# Patient Record
Sex: Female | Born: 1955 | Race: White | Hispanic: No | Marital: Single
Health system: Southern US, Community
[De-identification: ages and names within clinical notes are randomized; demographics above are authoritative.]

---

## 1999-09-21 ENCOUNTER — Encounter (INDEPENDENT_AMBULATORY_CARE_PROVIDER_SITE_OTHER): Payer: Self-pay

## 1999-09-21 ENCOUNTER — Other Ambulatory Visit: Admission: RE | Admit: 1999-09-21 | Discharge: 1999-09-21 | Payer: Self-pay | Admitting: *Deleted

## 2000-07-31 ENCOUNTER — Encounter (INDEPENDENT_AMBULATORY_CARE_PROVIDER_SITE_OTHER): Payer: Self-pay | Admitting: Specialist

## 2000-07-31 ENCOUNTER — Ambulatory Visit (HOSPITAL_COMMUNITY): Admission: RE | Admit: 2000-07-31 | Discharge: 2000-07-31 | Payer: Self-pay | Admitting: *Deleted

## 2003-11-08 ENCOUNTER — Encounter: Admission: RE | Admit: 2003-11-08 | Discharge: 2004-02-06 | Payer: Self-pay | Admitting: Counselor

## 2009-05-27 ENCOUNTER — Emergency Department (HOSPITAL_BASED_OUTPATIENT_CLINIC_OR_DEPARTMENT_OTHER): Admission: EM | Admit: 2009-05-27 | Discharge: 2009-05-27 | Payer: Self-pay | Admitting: Emergency Medicine

## 2009-05-27 ENCOUNTER — Ambulatory Visit: Payer: Self-pay | Admitting: Diagnostic Radiology

## 2009-07-20 ENCOUNTER — Encounter: Payer: Self-pay | Admitting: Family

## 2010-02-19 ENCOUNTER — Encounter: Payer: Self-pay | Admitting: Family

## 2010-02-26 ENCOUNTER — Ambulatory Visit: Payer: Self-pay | Admitting: Family

## 2010-02-26 DIAGNOSIS — E119 Type 2 diabetes mellitus without complications: Secondary | ICD-10-CM | POA: Insufficient documentation

## 2010-02-26 DIAGNOSIS — I10 Essential (primary) hypertension: Secondary | ICD-10-CM | POA: Insufficient documentation

## 2010-02-26 DIAGNOSIS — R9431 Abnormal electrocardiogram [ECG] [EKG]: Secondary | ICD-10-CM | POA: Insufficient documentation

## 2010-02-26 DIAGNOSIS — E785 Hyperlipidemia, unspecified: Secondary | ICD-10-CM | POA: Insufficient documentation

## 2010-02-27 ENCOUNTER — Encounter: Payer: Self-pay | Admitting: Family

## 2010-02-27 ENCOUNTER — Encounter (INDEPENDENT_AMBULATORY_CARE_PROVIDER_SITE_OTHER): Payer: Self-pay | Admitting: *Deleted

## 2010-03-06 ENCOUNTER — Telehealth (INDEPENDENT_AMBULATORY_CARE_PROVIDER_SITE_OTHER): Payer: Self-pay | Admitting: *Deleted

## 2010-03-07 ENCOUNTER — Ambulatory Visit: Payer: Self-pay

## 2010-03-07 ENCOUNTER — Encounter (HOSPITAL_COMMUNITY)
Admission: RE | Admit: 2010-03-07 | Discharge: 2010-05-10 | Disposition: A | Discharge status: Home / Self Care | Payer: Self-pay | Source: Ambulatory Visit | Attending: Internal Medicine | Admitting: Internal Medicine

## 2010-03-07 ENCOUNTER — Ambulatory Visit: Payer: Self-pay | Admitting: Internal Medicine

## 2010-03-12 ENCOUNTER — Ambulatory Visit: Payer: Self-pay

## 2010-03-13 ENCOUNTER — Encounter: Payer: Self-pay | Admitting: Internal Medicine

## 2010-03-15 ENCOUNTER — Telehealth: Payer: Self-pay | Admitting: Family

## 2010-03-28 ENCOUNTER — Ambulatory Visit: Payer: Self-pay | Admitting: Family

## 2010-04-25 ENCOUNTER — Telehealth: Payer: Self-pay | Admitting: Family

## 2010-04-25 ENCOUNTER — Ambulatory Visit: Payer: Self-pay | Admitting: Family

## 2010-04-25 ENCOUNTER — Encounter: Payer: Self-pay | Admitting: Gastroenterology

## 2010-04-25 DIAGNOSIS — Z860100 Personal history of colon polyps, unspecified: Secondary | ICD-10-CM | POA: Insufficient documentation

## 2010-04-25 LAB — CONVERTED CEMR LAB
ALT: 21 units/L (ref 0–35)
AST: 19 units/L (ref 0–37)
Albumin: 4.4 g/dL (ref 3.5–5.2)
Alkaline Phosphatase: 69 units/L (ref 39–117)
Bilirubin, Direct: 0.2 mg/dL (ref 0.0–0.3)
Cholesterol: 193 mg/dL (ref 0–200)
HDL: 52 mg/dL (ref >39)
Indirect Bilirubin: 0.7 mg/dL (ref 0.0–0.9)
LDL Cholesterol: 103 mg/dL — ABNORMAL HIGH (ref 0–99)
Total Bilirubin: 0.9 mg/dL (ref 0.3–1.2)
Total CHOL/HDL Ratio: 3.7
Total Protein: 7 g/dL (ref 6.0–8.3)
Triglycerides: 190 mg/dL — ABNORMAL HIGH (ref <150)
VLDL: 38 mg/dL (ref 0–40)

## 2010-04-26 ENCOUNTER — Encounter: Payer: Self-pay | Admitting: Family

## 2010-06-01 IMAGING — CT CT PELVIS W/ CM
2 of 5 series · 17 of 46 positions shown, 19 images · IV contrast (APPLIED)
Comparison: None

CT ABDOMEN

CLINICAL DATA: Right-sided abdominal pain

CT ABDOMEN AND PELVIS WITH CONTRAST
TECHNIQUE: Multidetector CT imaging of the abdomen and pelvis was
performed using the standard protocol following bolus
administration of intravenous contrast.
Contrast: 100 ml Omnipaque 300 IV.

[Series 2: abd/pelvis 5.0 b31f · axial · 0.95mm/px · z∈[-491,-81]mm · 14 of 94 slices shown, 16 images]
[im 6/94  soft-tissue]
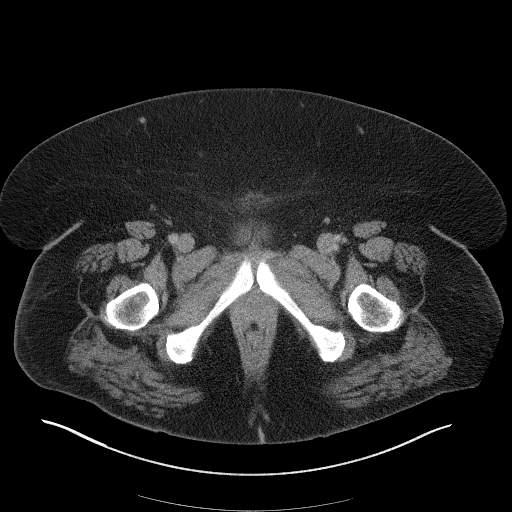
[im 6/94  bone]
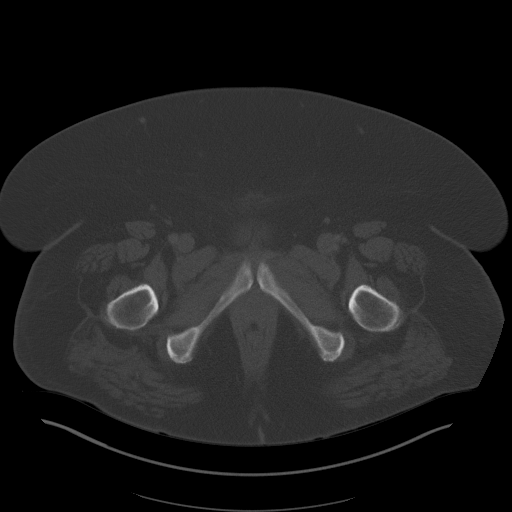
[im 11/94  soft-tissue]
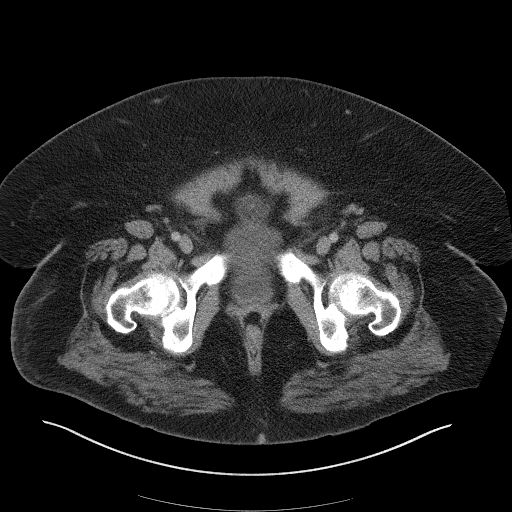
[im 21/94  soft-tissue]
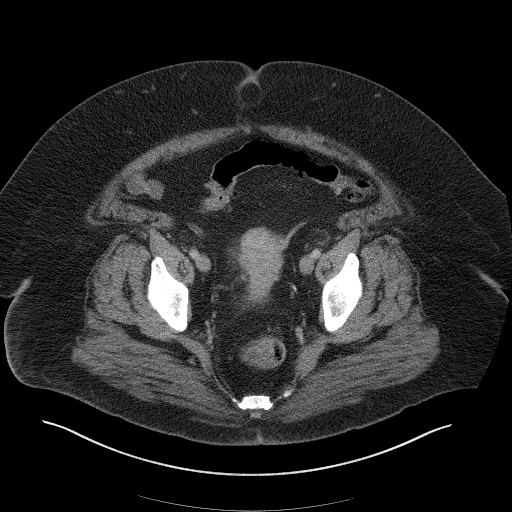
[im 26/94  soft-tissue]
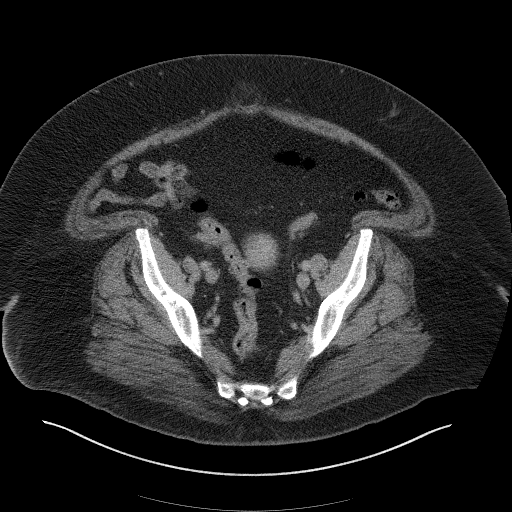
[im 32/94  soft-tissue]
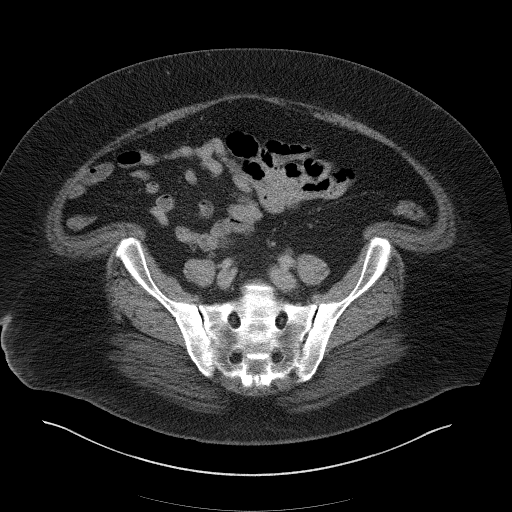
[im 37/94  soft-tissue]
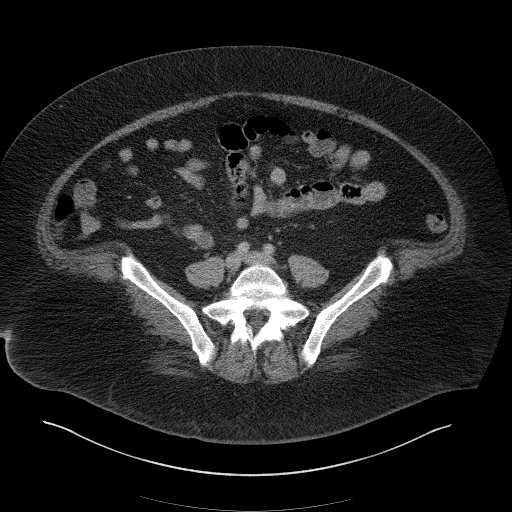
[im 42/94  soft-tissue]
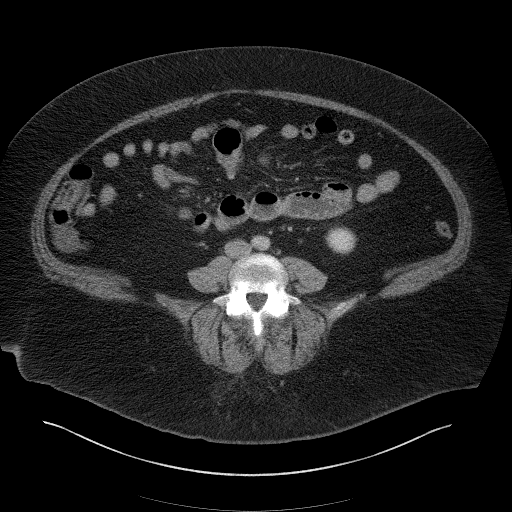
[im 52/94  soft-tissue]
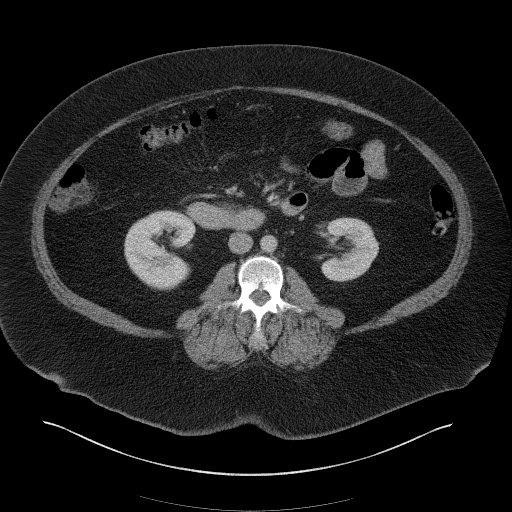
[im 57/94  soft-tissue]
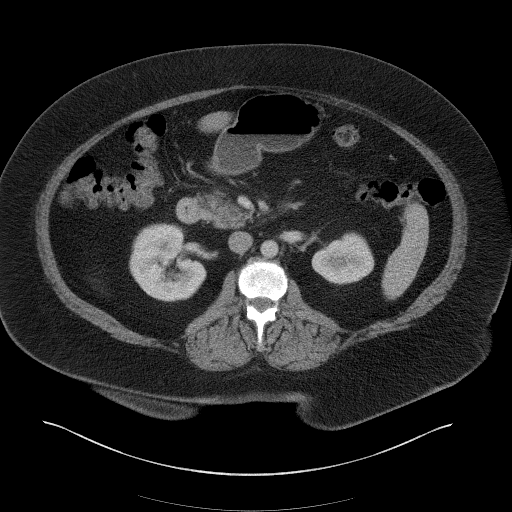
[im 57/94  bone]
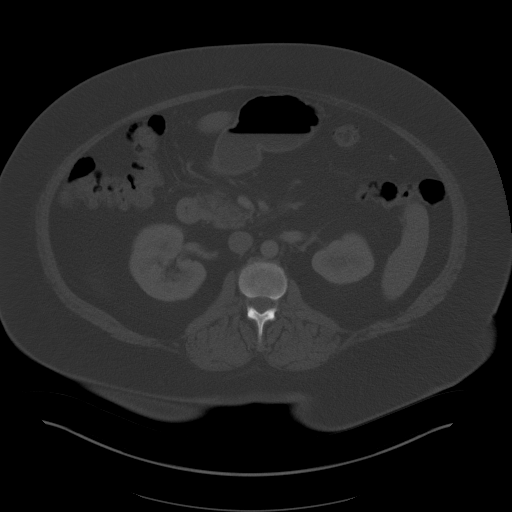
[im 63/94  soft-tissue]
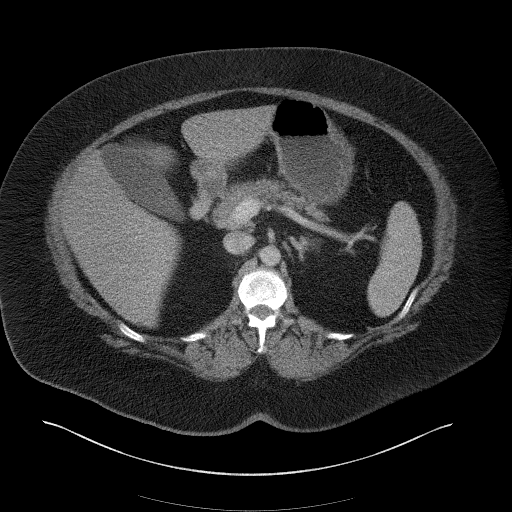
[im 68/94  soft-tissue]
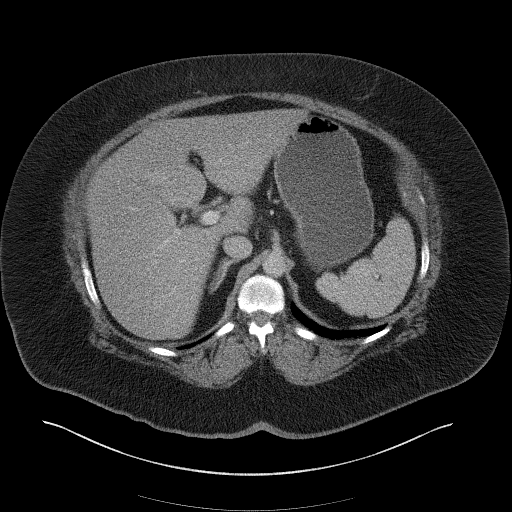
[im 73/94  soft-tissue]
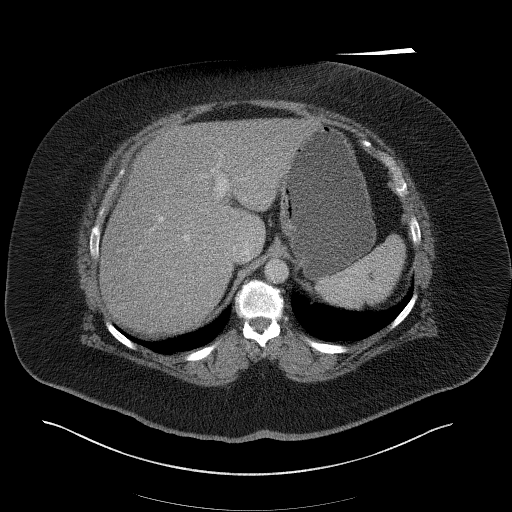
[im 83/94  soft-tissue]
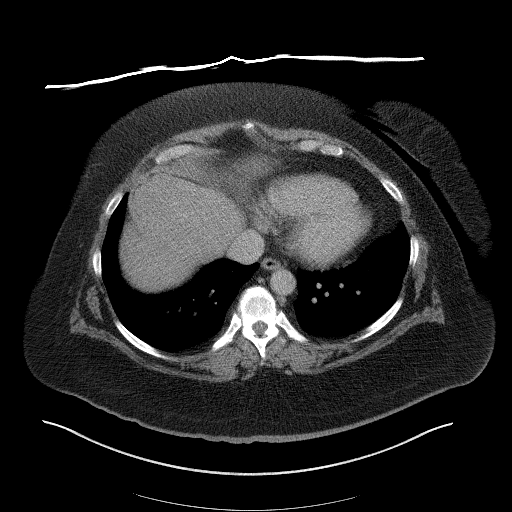
[im 88/94  soft-tissue]
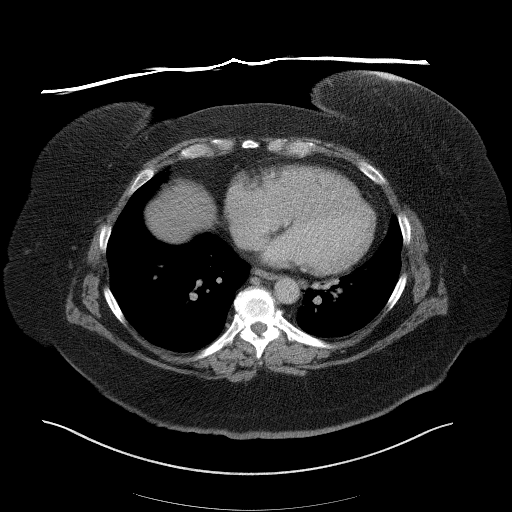

[Series 5: abd/pelvis 3.0 coronal · coronal · 1.02mm/px · 3 of 111 slices shown]
[im 37/111  soft-tissue]
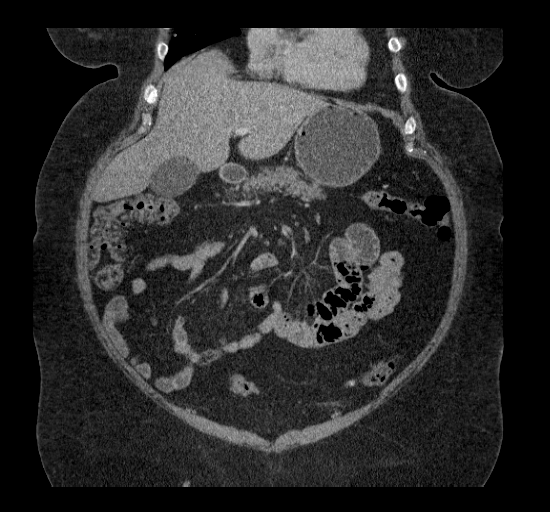
[im 49/111  soft-tissue]
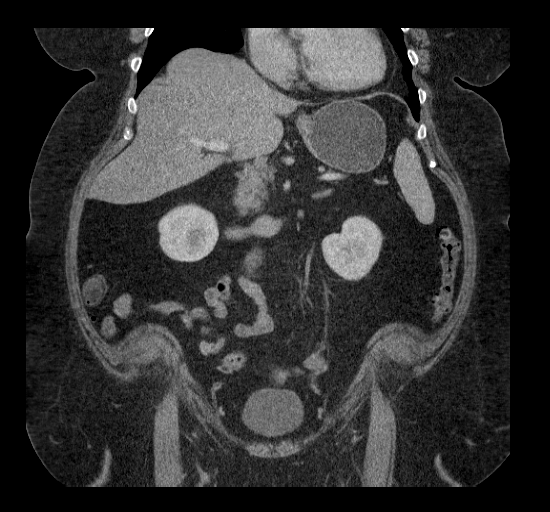
[im 62/111  soft-tissue]
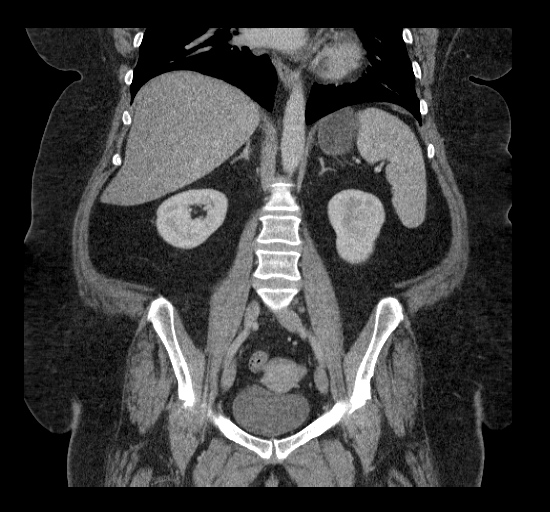

[17 of 46 positions shown; findings below may reference images not displayed]

FINDINGS: Lung bases are clear.  No effusions.  Heart is normal
size.

There is mild fatty infiltration of the liver.  Gallbladder
unremarkable.  Spleen, pancreas, adrenals, kidneys unremarkable.
No hydronephrosis or stones. Bowel grossly unremarkable.  No free
fluid, free air, or adenopathy. Aorta is normal caliber.

No acute bony abnormality.
IMPRESSION: No acute findings in the abdomen

Mild fatty infiltration of the liver.

CT PELVIS
FINDINGS: Appendix is visualized and is normal.  Uterus and adnexa
grossly unremarkable. Bowel grossly unremarkable.  No free fluid,
free air, or adenopathy. Small umbilical hernia containing fat.
There are scattered sigmoid diverticula.

No acute bony abnormality.
IMPRESSION: Sigmoid diverticulosis.

Small umbilical hernia containing fat.

## 2010-06-18 ENCOUNTER — Ambulatory Visit: Payer: Self-pay | Admitting: Family

## 2010-06-18 LAB — CONVERTED CEMR LAB
BUN: 13 mg/dL (ref 6–23)
CO2: 26 meq/L (ref 19–32)
Calcium: 9.4 mg/dL (ref 8.4–10.5)
Chloride: 103 meq/L (ref 96–112)
Creatinine, Ser: 0.7 mg/dL (ref 0.40–1.20)
Creatinine, Urine: 92.9 mg/dL
Glucose, Bld: 162 mg/dL — ABNORMAL HIGH (ref 70–99)
Hgb A1c MFr Bld: 7.4 % — ABNORMAL HIGH (ref <5.7)
Microalb Creat Ratio: 5.4 mg/g (ref 0.0–30.0)
Microalb, Ur: 0.5 mg/dL (ref 0.00–1.89)
Potassium: 4.8 meq/L (ref 3.5–5.3)
Sodium: 140 meq/L (ref 135–145)

## 2010-06-19 ENCOUNTER — Encounter: Payer: Self-pay | Admitting: Family

## 2010-07-12 ENCOUNTER — Telehealth: Payer: Self-pay | Admitting: Family

## 2010-08-01 ENCOUNTER — Ambulatory Visit: Payer: Self-pay | Admitting: Family

## 2010-08-03 ENCOUNTER — Telehealth: Payer: Self-pay | Admitting: Family

## 2010-08-13 ENCOUNTER — Telehealth: Payer: Self-pay | Admitting: Family

## 2010-08-24 ENCOUNTER — Telehealth: Payer: Self-pay | Admitting: Family

## 2010-08-24 ENCOUNTER — Ambulatory Visit: Payer: Self-pay | Admitting: Family

## 2010-09-17 ENCOUNTER — Encounter: Payer: Self-pay | Admitting: Family

## 2010-09-17 ENCOUNTER — Ambulatory Visit: Payer: Self-pay | Admitting: Family

## 2010-09-17 LAB — CONVERTED CEMR LAB
Blood Glucose, AC Bkfst: 143 mg/dL
Hgb A1c MFr Bld: 7.3 % — ABNORMAL HIGH (ref <5.7)

## 2010-09-18 ENCOUNTER — Telehealth: Payer: Self-pay | Admitting: Family

## 2010-10-23 ENCOUNTER — Telehealth: Payer: Self-pay | Admitting: Family

## 2010-11-04 LAB — CONVERTED CEMR LAB
ALT: 22 units/L (ref 0–35)
AST: 23 units/L (ref 0–37)
Albumin: 4.3 g/dL (ref 3.5–5.2)
Alkaline Phosphatase: 68 units/L (ref 39–117)
BUN: 18 mg/dL (ref 6–23)
Basophils Absolute: 0.1 10*3/uL (ref 0.0–0.1)
Basophils Relative: 1 % (ref 0–1)
CO2: 25 meq/L (ref 19–32)
Calcium: 9.8 mg/dL (ref 8.4–10.5)
Chloride: 103 meq/L (ref 96–112)
Cholesterol: 169 mg/dL (ref 0–200)
Creatinine, Ser: 0.67 mg/dL (ref 0.40–1.20)
Eosinophils Absolute: 0.2 10*3/uL (ref 0.0–0.7)
Eosinophils Relative: 3 % (ref 0–5)
Glucose, Bld: 140 mg/dL — ABNORMAL HIGH (ref 70–99)
HCT: 44.9 % (ref 36.0–46.0)
HDL: 47 mg/dL (ref >39)
Hemoglobin: 15.1 g/dL — ABNORMAL HIGH (ref 12.0–15.0)
Hgb A1c MFr Bld: 6.7 % — ABNORMAL HIGH (ref <5.7)
LDL Cholesterol: 76 mg/dL (ref 0–99)
Lymphocytes Relative: 25 % (ref 12–46)
Lymphs Abs: 2.4 10*3/uL (ref 0.7–4.0)
MCHC: 33.6 g/dL (ref 30.0–36.0)
MCV: 88.6 fL (ref 78.0–100.0)
Monocytes Absolute: 0.7 10*3/uL (ref 0.1–1.0)
Monocytes Relative: 7 % (ref 3–12)
Neutro Abs: 6.2 10*3/uL (ref 1.7–7.7)
Neutrophils Relative %: 65 % (ref 43–77)
Pap Smear: NORMAL
Platelets: 337 10*3/uL (ref 150–400)
Potassium: 4.3 meq/L (ref 3.5–5.3)
RBC: 5.07 M/uL (ref 3.87–5.11)
RDW: 13 % (ref 11.5–15.5)
Sodium: 140 meq/L (ref 135–145)
TSH: 1.131 microintl units/mL (ref 0.350–4.500)
Total Bilirubin: 0.9 mg/dL (ref 0.3–1.2)
Total CHOL/HDL Ratio: 3.6
Total Protein: 6.6 g/dL (ref 6.0–8.3)
Triglycerides: 229 mg/dL — ABNORMAL HIGH (ref <150)
VLDL: 46 mg/dL — ABNORMAL HIGH (ref 0–40)
WBC: 9.6 10*3/uL (ref 4.0–10.5)

## 2010-11-06 NOTE — Assessment & Plan Note (Signed)
Summary: 1 month follow up/mhf rsc with pt/mhf--Rm  5   Vital Signs:  Patient profile:   55 year old female Height:      65 inches Weight:      310.50 pounds BMI:     51.86 Temp:     98.2 degrees F oral Pulse rate:   72 / minute Pulse rhythm:   regular Resp:     16 per minute BP sitting:   120 / 86  (right arm) Cuff size:   thigh  Vitals Entered By: Mervin Kung CMA (March 28, 2010 10:01 AM) CC: Room 5  1 month follow up.  Needs refills on all meds. Simvastatin hurts pt's stomach and doesn't seem to digest. Is Patient Diabetic? No   CC:  Room 5  1 month follow up.  Needs refills on all meds. Simvastatin hurts pt's stomach and doesn't seem to digest..  History of Present Illness: Catherine Fleming is a 55 year old female who presents today for follow.    1 )DM- fasting sugars about 100, occasional post prandial up to 200  2) lipids- + muscle pain at times  3) Obesity- completed stress test- this was normal.  Allergies (verified): No Known Drug Allergies  Physical Exam  General:  Morbidly obese white female, awake, alert and NAD Lungs:  Normal respiratory effort, chest expands symmetrically. Lungs are clear to auscultation, no crackles or wheezes. Heart:  Normal rate and regular rhythm. S1 and S2 normal without gallop, murmur, click, rub or other extra sounds. Extremities:  No clubbing, cyanosis, edema, or deformity noted with normal full range of motion of all joints.     Impression & Recommendations:  Problem # 1:  HYPERLIPIDEMIA (ICD-272.4) Assessment Comment Only Will discontinue Simvastatin due to c/o mild myalgias.  Plan to place patient on Livalo Her updated medication list for this problem includes:    Livalo 2 Mg Tabs (Pitavastatin calcium) ..... One tablet by mouth daily in the evening  Problem # 2:  DM (ICD-250.00) Assessment: Comment Only A1C at goal, continue glucophage, ACE, diet, exercise and weight loss efforts. Her updated medication list for this  problem includes:    Lisinopril-hydrochlorothiazide 20-25 Mg Tabs (Lisinopril-hydrochlorothiazide) .Marland Kitchen... Take 1 tablet by mouth once a day    Glucophage Xr 500 Mg Xr24h-tab (Metformin hcl) ..... One tablet by mouth daily  Labs Reviewed: Creat: 0.67 (02/26/2010)    Reviewed HgBA1c results: 6.7 (02/26/2010)  Problem # 3:  HYPERTENSION (ICD-401.9) Assessment: Unchanged BP stable, continue same Her updated medication list for this problem includes:    Atenolol 100 Mg Tabs (Atenolol) .Marland Kitchen... Take 1 tablet by mouth once a day    Lisinopril-hydrochlorothiazide 20-25 Mg Tabs (Lisinopril-hydrochlorothiazide) .Marland Kitchen... Take 1 tablet by mouth once a day  BP today: 120/86 Prior BP: 120/80 (02/26/2010)  Labs Reviewed: K+: 4.3 (02/26/2010) Creat: : 0.67 (02/26/2010)   Chol: 169 (02/26/2010)   HDL: 47 (02/26/2010)   LDL: 76 (02/26/2010)   TG: 229 (02/26/2010)  Problem # 4:  OBESITY, MORBID (ICD-278.01) Assessment: Unchanged Patient is motivated to start exercise routine and I have cleared her to due so as her stress test was normal. Ht: 65 (03/28/2010)   Wt: 310.50 (03/28/2010)   BMI: 51.86 (03/28/2010)  Complete Medication List: 1)  Livalo 2 Mg Tabs (Pitavastatin calcium) .... One tablet by mouth daily in the evening 2)  Atenolol 100 Mg Tabs (Atenolol) .... Take 1 tablet by mouth once a day 3)  Fluoxetine Hcl 20 Mg Caps (Fluoxetine hcl) .Marland KitchenMarland KitchenMarland Kitchen  Take 1 capsule by mouth once a day 4)  Lisinopril-hydrochlorothiazide 20-25 Mg Tabs (Lisinopril-hydrochlorothiazide) .... Take 1 tablet by mouth once a day 5)  [employer] Membership  .... Patient would benefit medically from regular exercise including water aerobic 6)  Glucophage Xr 500 Mg Xr24h-tab (Metformin hcl) .... One tablet by mouth daily  Patient Instructions: 1)  Please follow up in 1 month- come fasting to this appointment.  2)  Good luck with the diet and exercise. Prescriptions: LIVALO 2 MG TABS (PITAVASTATIN CALCIUM) one tablet by mouth daily in the  evening  #30 x 5   Entered and Authorized by:   Lemont Fillers FNP   Signed by:   Lemont Fillers FNP on 03/28/2010   Method used:   Electronically to        [facility] (retail)       [address]       [place], [place]  [address]       Ph: [phone]       Fax: [phone]   RxID:   [id number]   Current Allergies (reviewed today): No known allergies

## 2010-11-06 NOTE — Assessment & Plan Note (Signed)
Summary: NEW PT PHYSICAL DOES NOT NEED PAP/DT--room 4   Vital Signs:  Patient profile:   55 year old female Height:      65 inches Weight:      311.50 pounds BMI:     52.02 Temp:     97.9 degrees F oral Pulse rate:   72 / minute Pulse rhythm:   regular Resp:     18 per minute BP sitting:   120 / 80  (right arm) Cuff size:   thigh  Vitals Entered By: Mervin Kung CMA (Mar 01, 2010 9:03 AM) CC: room 4  New to establish primary care and get physical. Is Patient Diabetic? Yes   CC:  room 4  New to establish primary care and get physical..  History of Present Illness: [name] is a 55 year old female who presents today to establish care.  She was previously being followed by Dr. Quentin Fleming.   Preventative- Last mammogram was done in 2009.  She follows with Dr. Lisbeth Fleming of GYN.  Patient had hyperplastic lesion on colonoscopy, for which she is being followed by Dr. Alben Fleming of GI.  Exercise-  wants to start water aerobics.  Patient has lost 50 pounds with dietary changes.  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    Preventive Screening-Counseling & Management  Alcohol-Tobacco     Alcohol drinks/day: 2 glasses every night     Alcohol type: wine     Smoking Status: never  Caffeine-Diet-Exercise     Caffeine use/day: 2 cups coffee daily     Does Patient Exercise: no  Allergies (verified): No Known Drug Allergies  Past History:  Family History: Last updated: 2010-03-01 MI-- both parents, deceased Heart Disease-- brother Stroke-- Maternal grandparents HTN-- both parents, sister, brother Diabetes--brother  Social History: Last updated: 2010-03-01 Works at [employer] as a Museum/gallery conservator. Never smoked. 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 Single No children + wine- 2 1/2 glasses denies drug useSmoking Status:  never Caffeine use/day:  2 cups coffee daily Does Patient Exercise:  no  Review of Systems       Constitutional: Denies Fever ENT:  Denies nasal congestion or sore throat. Resp: Denies cough CV:  Denies Chest Pain GI:  Denies nausea or vomitting GU: Denies dysuria Lymphatic: Denies lymphadenopathy Musculoskeletal:  R hip pain (sees [facility] [place]) Skin:  notes occasional itching lesions on her skin for which she uses hydrocortisone Psychiatric: uses fluoxetine for anxiety/OCD- feels that this helps significantly with her symptoms.  Denies  any history of depression. Neuro: Denies numbness     Physical Exam  General:  Morbidly obese white female, awake, alert and NAD Head:  Normocephalic and atraumatic without obvious abnormalities. No apparent alopecia or balding. Eyes:  PERRLA Ears:  External ear exam shows no significant lesions or deformities.  Otoscopic examination reveals clear canals, tympanic membranes are intact bilaterally without bulging, retraction, inflammation or discharge. Hearing is grossly normal bilaterally. Mouth:  Oral mucosa and oropharynx without lesions or exudates.  Teeth in good repair. Neck:  Thick neck, no palpable masses or thyroid enlargement, but exam limited due to habitus. Breasts:  No mass, nodules, thickening, tenderness, bulging, retraction, inflamation, nipple discharge or skin changes noted.   Lungs:  Normal respiratory effort, chest expands symmetrically.  Lungs are clear to auscultation, no crackles or wheezes. Heart:  Normal rate and regular rhythm. S1 and S2 normal without gallop, murmur, click, rub or other extra sounds. Abdomen:  protuberant, soft, non-tender, and no distention.  Unable to assess for HSM due to habitus Genitalia:  deferred to GYN Extremities:  No clubbing, cyanosis, edema, or deformity noted with normal full range of motion of all joints.   Neurologic:  No cranial nerve deficits noted. Station and gait are normal. Sensory, motor and coordinative functions appear intact. Skin:  few scabbed lesions noted on forearms/legs Axillary Nodes:  No palpable lymphadenopathy Psych:  Cognition and judgment appear intact. Alert and cooperative with normal attention span and concentration. No apparent delusions, illusions, hallucinations   Impression & Recommendations:  Problem # 1:  Preventive Health Care (ICD-V70.0) Assessment Comment Only Pt follows with GYN for Paps, immunizations reviewed and up to date.  Due for mammogram- will order today.  Check fasting labs.  Patient counselled on diet and weight loss.   EKG performed today notes TWI in V2-V4 and AVL.  Patient is asymptomatic.  Will refer to cardiology for stress testing.  Patient was advised to hold off on initiation of exercise routine until we have reviewed her stress test results.  She verbalizes understanding. Plan follow up in 1 month. Orders: Mammogram (Screening) (Mammo) T-Comprehensive Metabolic Panel [phone]) T-CBC w/Diff [id number]) T-TSH [id number]) Assessment: New  Complete Medication List: 1)  Simvastatin 80 Mg Tabs (Simvastatin) .... Take 1 tab by mouth at bedtime 2)  Atenolol 100 Mg Tabs (Atenolol) .... Take 1 tablet by mouth once a day 3)  Fluoxetine Hcl 20 Mg Caps (Fluoxetine hcl) .... Take 1 capsule by mouth once a day 4)  Lisinopril-hydrochlorothiazide 20-25 Mg Tabs (Lisinopril-hydrochlorothiazide) .... Take 1 tablet by mouth once a day 5)  [employer]  Membership  .... Patient would benefit medically from regular exercise including water aerobic 6)  Glucophage Xr 500 Mg Xr24h-tab (Metformin hcl) .... One tablet by mouth daily  Other Orders: T-Lipid Profile ([id number]) T-Hgb A1C ([id number]) Cardiology Referral (Cardiology)  Patient Instructions: 1)  Please follow up in 1 month. 2)  Good job with weight loss, keep up the good work. 3)  Start exercising slowly, advance as tolerated. Prescriptions: [employer] MEMBER[employer]IP Patient would benefit medically from regular exercise including water aerobic  #1 x 0   Entered and Authorized by:   Lemont Fillers FNP   Signed by:   Lemont Fillers FNP on 02/26/2010   Method used:   Print then Give to Patient   RxID:   [id number]    Preventive Care Screening  Pap Smear:    Date:  07/07/2009    Results:  normal   Colonoscopy:    Date:  02/06/2009    Results:  Hyperplastic Polyp   Mammogram:    Date:  04/06/2008    Results:  normal   Last Tetanus Booster:    Date:  12/06/2003    Results:  Historical     Immunization History:  Influenza Immunization History:    Influenza:  historical (06/30/2009)   Current Allergies (reviewed today): No known allergies

## 2010-11-06 NOTE — Assessment & Plan Note (Signed)
Summary: sinus inf? / tf,cma--Rm 5   Vital Signs:  Patient profile:   55 year old female Height:      65 inches Weight:      314.50 pounds BMI:     52.52 Temp:     98.4 degrees F oral Pulse rate:   84 / minute Pulse rhythm:   regular Resp:     16 per minute BP sitting:   132 / 80  (right arm) Cuff size:   large  Vitals Entered By: Mervin Kung CMA Duncan Dull) (August 01, 2010 2:16 PM) CC: Rm 5  Intermittent left ear pain and fullness x 1 month, now increasing x 2 days. Has taken Coricidin cough & cold OTC. Is Patient Diabetic? Yes Pain Assessment Patient in pain? yes     Location: left ear Comments Pt agrees all med doses and directions are correct. Nicki Guadalajara Fergerson CMA Duncan Dull)  August 01, 2010 2:23 PM    Primary Care Clarabelle Oscarson:  Lemont Fillers FNP  CC:  Rm 5  Intermittent left ear pain and fullness x 1 month and now increasing x 2 days. Has taken Coricidin cough & cold OTC.Marland Kitchen  History of Present Illness: Ms Rappleye is a 55 year old female who presents with complaint of left ear congestion/pain x 2 days.  Has some mild nasal congestion.  Has tried coricidin without improvement.  Denies fever, mild dizziness a few days ago. Denies cough.  Allergies (verified): No Known Drug Allergies  Past History:  Past Medical History: Last updated: 02/26/2010 Diabetes Hypercholesterolemia Colon polyps Diverticulosis  Past Surgical History: Last updated: 02/26/2010 Right Breast Biopsy--1991  Review of Systems       see HPI  Physical Exam  General:  Morbidly obese white female, awake, altert and in NAD Head:  Normocephalic and atraumatic without obvious abnormalities. No apparent alopecia or balding. Ears:  R TM with red streak, some bubbles noted behind the membrane.  L TM is partially obscured by cerumen, but appears erythematous Mouth:  Oral mucosa and oropharynx without lesions or exudates.  Teeth in good repair. Neck:  No deformities, masses, or tenderness  noted. Lungs:  Normal respiratory effort, chest expands symmetrically. Lungs are clear to auscultation, no crackles or wheezes. Heart:  Normal rate and regular rhythm. S1 and S2 normal without gallop, murmur, click, rub or other extra sounds.   Impression & Recommendations:  Problem # 1:  OTITIS MEDIA, ACUTE, LEFT (ICD-382.9) Assessment New Will treat with amoxicillin, pt instructed to call if symptoms worsen or do not improve. Flu shot and pneumovax given today. Her updated medication list for this problem includes:    Aspirin 81 Mg Chew (Aspirin) ..... One tablet by mouth daily    Amoxicillin 500 Mg Cap (Amoxicillin) .Marland Kitchen... Take 1 capsule by mouth three times a day x 10 days  Complete Medication List: 1)  Livalo 2 Mg Tabs (Pitavastatin calcium) .... One tablet by mouth daily in the evening 2)  Atenolol 100 Mg Tabs (Atenolol) .... Take 1 tablet by mouth once a day 3)  Citalopram Hydrobromide 20 Mg Tabs (Citalopram hydrobromide) .... One tablet by mouth daily 4)  Lisinopril-hydrochlorothiazide 20-25 Mg Tabs (Lisinopril-hydrochlorothiazide) .... Take 1 tablet by mouth once a day 5)  Glucophage Xr 500 Mg Xr24h-tab (Metformin hcl) .... Two tabs by mouth daily 6)  Aspirin 81 Mg Chew (Aspirin) .... One tablet by mouth daily 7)  One Touch Ultra Mini Test Strips  .... Check blood sugar once a day. 8)  Amoxicillin 500  Mg Cap (Amoxicillin) .... Take 1 capsule by mouth three times a day x 10 days  Other Orders: Admin 1st Vaccine ([address]) Flu Vaccine 59yrs + ([address]) Pneumococcal Vaccine (09811) Admin of Any Addtl Vaccine (91478)  Patient Instructions: 1)  Please call if your symptoms worsen or do not improve. 2)  Please return in December for routine follow up. Prescriptions: AMOXICILLIN 500 MG CAP (AMOXICILLIN) Take 1 capsule by mouth three times a day X 10 days  #30 x 0   Entered and Authorized by:   Lemont Fillers FNP   Signed by:   Lemont Fillers FNP on 08/01/2010   Method  used:   Electronically to        [facility] (retail)       [address]       [place], [place]  [address]       Ph: [phone]       Fax: [phone]   RxID:   [id number]    Orders Added: 1)  Admin 1st Vaccine [90471] 2)  Flu Vaccine 59yrs + [34742] 3)  Pneumococcal Vaccine [59563] 4)  Admin of Any Addtl Vaccine [90472] 5)  Est. Patient Level III [87564]   Immunizations Administered:  Pneumonia Vaccine:    Vaccine Type: Pneumovax    Site: right deltoid    Mfr: Merck    Dose: 0.5 ml    Route: IM    Given by: Mervin Kung CMA (AAMA)    Exp. Date: 12/20/2011    Lot #: [id number]    VIS given: 09/11/09 version given August 01, 2010.   Immunizations Administered:  Pneumonia Vaccine:    Vaccine Type: Pneumovax    Site: right deltoid    Mfr: Merck    Dose: 0.5 ml    Route: IM    Given by: Mervin Kung CMA (AAMA)    Exp. Date: 12/20/2011    Lot #: [id number]    VIS given: 09/11/09 version given August 01, 2010.  Current Allergies (reviewed today): No known allergies     Flu Vaccine Consent Questions     Do you have a history of severe allergic reactions to this vaccine? no    Any prior history of allergic reactions to egg and/or gelatin? no    Do you have a sensitivity to the preservative Thimersol? no    Do you have a past history of Guillan-Barre Syndrome? no    Do you currently have an acute febrile illness? no    Have you ever had a severe reaction to latex? no    Vaccine information given and explained to patient? yes    Are you currently pregnant? no    Lot Number:[id number]   Exp Date:04/06/2011   Site Given  Left Deltoid IM.  Nicki Guadalajara Fergerson CMA Duncan Dull)  August 01, 2010 2:50 PM

## 2010-11-06 NOTE — Letter (Signed)
   [place] at [facility] [address] [place], [place]  [address]  [place] Phone: [phone]      June 19, 2010   [facility] [address] [place], [place] [address]  RE:  LAB RESULTS  Dear  Ms. Doshi,  The following is an interpretation of your most recent lab tests.  Please take note of any instructions provided or changes to medications that have resulted from your lab work.  ELECTROLYTES:  Good - no changes needed  KIDNEY FUNCTION TESTS:  Good - no changes needed  LIPID PANEL:  Fair - review at your next visit Triglyceride: 190   Cholesterol: 193   LDL: 103   HDL: 52   Chol/HDL%:  3.7 Ratio   DIABETIC STUDIES:  Please see the enclosed Rx for a change in medication Blood Glucose: 162   HgbA1C: 7.4   Microalbumin/Creatinine Ratio: 5.4     Your A1C (diabetic test) is not at goal.  Please increase your Glucophage to 2 tabs once daily and work hard on diet and exercise.  Follow up in 3 months.  Your triglycerides are also high- diet and exercise will help this as well.        Medications Prescribed or Changed GLUCOPHAGE XR 500 MG XR24H-TAB (METFORMIN HCL) two tabs by mouth daily   Sincerely Yours,    Lemont Fillers FNP  Appended Document:  Mailed.

## 2010-11-06 NOTE — Progress Notes (Signed)
Summary: ear pain  Phone Note Call from Patient Call back at [phone]   Caller: Patient Call For: Lemont Fillers FNP Summary of Call: Pt called stating she has been on abx x 2 days and is still having sharp ear pain. Does not feel worse but is not improved. Please advise. Nicki Guadalajara Fergerson CMA Duncan Dull)  August 03, 2010 3:32 PM   Follow-up for Phone Call        rx sent to pharmacy.  Pt was notified of plan per CMA. Follow-up by: Lemont Fillers FNP,  August 03, 2010 4:14 PM    New/Updated Medications: ANTIPYRINE-BENZOCAINE 54-14 MG/ML SOLN (BENZOCAINE-ANTIPYRINE) 2-4 drops in ear left ear three times a day as needed for pain Prescriptions: ANTIPYRINE-BENZOCAINE 54-14 MG/ML SOLN (BENZOCAINE-ANTIPYRINE) 2-4 drops in ear left ear three times a day as needed for pain  #1 x 0   Entered and Authorized by:   Lemont Fillers FNP   Signed by:   Lemont Fillers FNP on 08/03/2010   Method used:   Electronically to        [facility] [place] [address] (retail)       [address]       [place], [place]  [address]       Ph: [phone]       Fax: [phone]   RxID:   [id number]   Appended Document: ear pain Pt called stating [facility] [address] was having computer problems and not able to fill rx's at this time. Called rx into [facility] on [address]. Cancelled rx at [facility]. Pt is aware.

## 2010-11-06 NOTE — Progress Notes (Signed)
Summary: add baby asa  Phone Note Outgoing Call   Summary of Call: Please call patient this afternoon and let her know that I reviewed her medication list and realized that she is not on a baby aspirin.  She should start aspirin 81mg  by mouth daily to protect her heart. Initial call taken by: Lemont Fillers FNP,  April 25, 2010 10:28 AM  Follow-up for Phone Call        Left message with pt's sister, Judeth Cornfield to have pt return my call. Nicki Guadalajara Fergerson CMA Duncan Dull)  April 25, 2010 2:20 PM   Notified pt per [facility] instructions. Pt is agreeable.  Nicki Guadalajara Fergerson CMA Duncan Dull)  April 26, 2010 9:12 AM

## 2010-11-06 NOTE — Letter (Signed)
Summary: [facility]   Imported By: Sherian Rein 06/27/2010 13:15:38  _____________________________________________________________________  External Attachment:    Type:   Image     Comment:   External Document

## 2010-11-06 NOTE — Letter (Signed)
   [place] at [facility] [address] [place], [place]  [address]  [place] Phone: [phone]      April 26, 2010   [facility] [address] [place], [place] [address]  RE:  LAB RESULTS  Dear  Ms. Guajardo,  The following is an interpretation of your most recent lab tests.  Please take note of any instructions provided or changes to medications that have resulted from your lab work.  LIVER FUNCTION TESTS:  Good - no changes needed  LIPID PANEL:  Stable - no changes needed Triglyceride: 190   Cholesterol: 193   LDL: 103   HDL: 52   Chol/HDL%:  3.7 Ratio    Please add Aspirin 81 mg by mouth daily to protect your heart.   Sincerely Yours,    Lemont Fillers FNP  Appended Document:  Mailed.

## 2010-11-06 NOTE — Letter (Signed)
Summary: Screening/[employer]  Screening/[employer]   Imported By: Lanelle Bal 03/07/2010 11:29:03  _____________________________________________________________________  External Attachment:    Type:   Image     Comment:   External Document

## 2010-11-06 NOTE — Miscellaneous (Signed)
Summary: eye exam, 2009  Clinical Lists Changes  Observations: Added new observation of DMEYEEXAMNXT: 05/2009 (06/18/2010 14:53) Added new observation of DMEYEEXMRES: normal (04/25/2008 14:55) Added new observation of EYE EXAM BY: [facility]  (04/25/2008 14:55) Added new observation of DIAB EYE EX: normal (04/25/2008 14:55)        Diabetes Management Exam:    Eye Exam:       Eye Exam done elsewhere          Date: 04/25/2008          Results: normal          Done by: [facility]

## 2010-11-06 NOTE — Letter (Signed)
Summary: Primary Care Consult Scheduled Letter  [place] at [facility]  [address]   [place], [place] [address]   Phone: [phone]  Fax: [phone]      02/27/2010 MRN: [id number]  [facility] [address] [place], [place]  [address]    Dear Ms. Roher,      We have scheduled an appointment for you.  At the recommendation of MELISSA O'SULLIVAN,FNP, we have scheduled you for NUCLEAR STRESS TEST @ [facility]  on JUNE 1 ,2011 at 8:15AM.  Their address [address]  [place] [address] . The office phone number is [phone].  If this appointment day and time is not convenient for you, please feel free to call the office of the doctor you are being referred to at the number listed above and reschedule the appointment.     It is important for you to keep your scheduled appointments. We are here to make sure you are given good patient care. If you have questions or you have made changes to your appointment, please notify us at  [phone], ask for HELEN.    Thank you,  Darral Dash Patient Care Coordinator [place] at [facility]

## 2010-11-06 NOTE — Progress Notes (Signed)
Summary: need referral??  Phone Note Call from Patient Call back at [phone]   Caller: Patient Call For: Catherine Fleming Summary of Call: Pt called stating that her left ear pain is better but she cannot hear out of that ear. Wants to know if she needs to be referred to ENT? Also has noticed some light brown color to her sputum recently.  Please advise.   Follow-up for Phone Call        I would recommend that she wait a few more weeks to see if the hearing in the left ear improves-  sometimes it takes several weeks after an infection for the fluid to resolve in that ear.   Brown sputum could be bronchitis.  If pt has cough or recurrent brown mucous, she should schedule OV. Follow-up by: Catherine Fleming,  August 13, 2010 4:21 PM  Additional Follow-up for Phone Call Additional follow up Details #1::        Pt advised and voices understanding. Declines appt at this time. States she will keep f/u in December. Catherine Fleming CMA Duncan Dull)  August 13, 2010 4:48 PM

## 2010-11-06 NOTE — Progress Notes (Signed)
Summary: Nuclear Pre-Procedure  Phone Note Outgoing Call Call back at [facility] Phone [phone]   Call placed by: Stanton Kidney, EMT-P,  Mar 06, 2010 2:19 PM Action Taken: Phone Call Completed Summary of Call: Reviewed information on Myoview Information Sheet (see scanned document for further details).  Spoke with Patient's sister, Judeth Cornfield.    Nuclear Med Background Indications for Stress Test: Evaluation for Ischemia, Abnormal EKG        Nuclear Pre-Procedure Cardiac Risk Factors: Family History - CAD, Lipids, NIDDM, Obesity Height (in): 65

## 2010-11-06 NOTE — Assessment & Plan Note (Signed)
Summary: Cardiology Nuclear Study  Nuclear Med Background Indications for Stress Test: Evaluation for Ischemia, Abnormal EKG     Symptoms: Rapid HR    Nuclear Pre-Procedure Cardiac Risk Factors: Family History - CAD, Lipids, NIDDM, Obesity Caffeine/Decaff Intake: None NPO After: 8:00 PM Lungs: clear IV 0.9% NS with Angio Cath: 22g     IV Site: (R) AC IV Started by: Irean Hong RN Chest Size (in) 42     Cup Size DD     Height (in): 65 Weight (lb): 309 BMI: 51.61 Tech Comments: Atenolol 4pm yesterday.  Nuclear Med Study 1 or 2 day study:  2 day     Stress Test Type:  Eugenie Birks Reading MD:  Dietrich Pates, MD     Referring MD:  R.Yoo Resting Radionuclide:  Technetium 22m Tetrofosmin     Resting Radionuclide Dose:  33.0 mCi  Stress Radionuclide:  Technetium 47m Tetrofosmin     Stress Radionuclide Dose:  33.0 mCi   Stress Protocol   Lexiscan: 0.4 mg   Stress Test Technologist:  Milana Na EMT-P     Nuclear Technologist:  Burna Mortimer Deal RT-N  Rest Procedure  Myocardial perfusion imaging was performed at rest 45 minutes following the intravenous administration of Myoview Technetium 44m Tetrofosmin.  Stress Procedure  The patient received IV Lexiscan 0.4 mg over 15-seconds.  Myoview injected at 30-seconds.  There were no significant changes, + sob, and lt. headed with infusion.  Quantitative spect images were obtained after a 45 minute delay.  QPS Raw Data Images:  Extensive soft tissue (diaphragm, subcutaneous fat) surrounds heart. Stress Images:  Normal perfusion with mild apical thinning. Rest Images:  NO significant change from the stress images. Subtraction (SDS):  No evidence of ischemia. Transient Ischemic Dilatation:  1.03  (Normal <1.22)  Lung/Heart Ratio:  .39  (Normal <0.45)  Quantitative Gated Spect Images QGS EDV:  79 ml QGS ESV:  24 ml QGS EF:  70 %   Overall Impression  Exercise Capacity: Lexiscan protocol BP Response: Normal blood pressure  response. Clinical Symptoms: No chest pain ECG Impression: No significant ST segment change suggestive of ischemia. Overall Impression: Normal stress nuclear study.

## 2010-11-06 NOTE — Progress Notes (Signed)
  Phone Note Outgoing Call   Call placed by: Lemont Fillers FNP,  August 24, 2010 4:54 PM Call placed to: Patient Summary of Call: Left message on home phone instructing pt to discard rx for Avelox as the "ABC pack" which was mistakenly ordered is a 5 pill pack.  Pt will only need one additional tablet since she has 6 tabs of samples. Initial call taken by: Lemont Fillers FNP,  August 24, 2010 4:55 PM  Follow-up for Phone Call        Called patient back.  She reports that her ear is feeling much better. WIll send single tablet to her pharmacy for 7th day of avelox. Patient aware. Follow-up by: Lemont Fillers FNP,  August 27, 2010 9:55 AM    New/Updated Medications: AVELOX 400 MG TABS (MOXIFLOXACIN HCL) take this tablet to complete the 7th day of treatment Prescriptions: AVELOX 400 MG TABS (MOXIFLOXACIN HCL) take this tablet to complete the 7th day of treatment  #1 x 0   Entered and Authorized by:   Lemont Fillers FNP   Signed by:   Lemont Fillers FNP on 08/27/2010   Method used:   Electronically to        [facility]       [address]       [place], [place]  [address]       Ph: [phone]       Fax: [phone]   RxID:   [id number]

## 2010-11-06 NOTE — Progress Notes (Signed)
Summary: refill--one touch ultra mini test strip  Phone Note Call from Patient Call back at [phone]   Caller: Patient Call For: Lemont Fillers FNP Summary of Call: Pt left voice message requesting refill on One Touch Ultra Mini test strips. Left message for pt to call me back with frequency of testing and confirm pharmacy to send rx to. Nicki Guadalajara Fergerson CMA Duncan Dull)  July 12, 2010 11:44 AM   Follow-up for Phone Call        Pt returned my call and states she is checking her blood suagr once daily. [facility] uses [facility] [address]. Test strips refillled. Pt is aware. Nicki Guadalajara Fergerson CMA Duncan Dull)  July 12, 2010 11:58 AM     New/Updated Medications: * ONE TOUCH ULTRA MINI TEST STRIPS Check blood sugar once a day. Prescriptions: ONE TOUCH ULTRA MINI TEST STRIPS Check blood sugar once a day.  #50 x 2   Entered by:   Mervin Kung CMA (AAMA)   Authorized by:   Lemont Fillers FNP   Signed by:   Mervin Kung CMA (AAMA) on 07/12/2010   Method used:   Faxed to ...       [facility]       [address]       [place], [place]  [address]       Ph: [phone]       Fax: [phone]   RxID:   [id number]

## 2010-11-06 NOTE — Letter (Signed)
Summary: Previsit letter  [facility]  [address] [place], [place] [address]   Phone: [phone]  Fax: [phone]       04/25/2010 MRN: [id number]  [facility] [address] [place], [place]  [address]  Dear Ms. Coombes,  Welcome to the Gastroenterology Division at [facility].    You are scheduled to see a nurse for your pre-procedure visit on 05-25-10 at 11am on the 3rd floor at [facility], [address]  We ask that you try to arrive at our office 15 minutes prior to your appointment time to allow for check-in.  Your nurse visit will consist of discussing your medical and surgical history, your immediate family medical history, and your medications.    Please bring a complete list of all your medications or, if you prefer, bring the medication bottles and we will list them.  We will need to be aware of both prescribed and over the counter drugs.  We will need to know exact dosage information as well.  If you are on blood thinners (Coumadin, Plavix, Aggrenox, Ticlid, etc.) please call our office today/prior to your appointment, as we need to consult with your physician about holding your medication.   Please be prepared to read and sign documents such as consent forms, a financial agreement, and acknowledgement forms.  If necessary, and with your consent, a friend or relative is welcome to sit-in on the nurse visit with you.  Please bring your insurance card so that we may make a copy of it.  If your insurance requires a referral to see a specialist, please bring your referral form from your primary care physician.  No co-pay is required for this nurse visit.     If you cannot keep your appointment, please call [phone] to cancel or reschedule prior to your appointment date.  This allows [place] the opportunity to schedule an appointment for another patient in need of care.    Thank you for choosing [facility] for your medical needs.  We  appreciate the opportunity to care for you.  Please visit [place] at our website  to learn more about our practice.                     Sincerely.                                                                                                                   The Gastroenterology Division

## 2010-11-06 NOTE — Progress Notes (Signed)
Summary: stress test result  Phone Note Call from Patient Call back at [phone]   Caller: Patient Call For: Lemont Fillers FNP Summary of Call: Pt called requesting stress test results. Please advise.  Mervin Kung CMA  March 15, 2010 2:29 PM   Follow-up for Phone Call        Pls notify patient that her stress test was normal. thanks Follow-up by: Lemont Fillers FNP,  March 15, 2010 2:38 PM  Additional Follow-up for Phone Call Additional follow up Details #1::        Pt advised of normal result.  Mervin Kung CMA  March 15, 2010 2:47 PM

## 2010-11-06 NOTE — Letter (Signed)
   [place] at [facility] [address] [place], [place]  [address]  [place] Phone: [phone]      Feb 27, 2010   [facility] [address] [place], [place] [address]  RE:  LAB RESULTS  Dear  Ms. Mcgue,  The following is an interpretation of your most recent lab tests.  Please take note of any instructions provided or changes to medications that have resulted from your lab work.  ELECTROLYTES:  Good - no changes needed  KIDNEY FUNCTION TESTS:  Good - no changes needed  LIPID PANEL:  Stable - no changes needed Triglyceride: 229   Cholesterol: 169   LDL: 76   HDL: 47   Chol/HDL%:  3.6 Ratio  THYROID STUDIES:  Thyroid studies normal TSH: 1.131     DIABETIC STUDIES:  Good - no changes needed Blood Glucose: 140   HgbA1C: 6.7     CBC:  Good - no changes needed   Sincerely Yours,    Lemont Fillers FNP

## 2010-11-06 NOTE — Assessment & Plan Note (Signed)
Summary: 2 month follow up/mhf--rm 5   Vital Signs:  Patient profile:   55 year old female Height:      65 inches Weight:      318 pounds BMI:     53.11 Temp:     98.6 degrees F oral Pulse rate:   66 / minute Pulse rhythm:   regular Resp:     18 per minute BP sitting:   126 / 76  (right arm) Cuff size:   thigh CC: Rm 5   2 month f/u. Has earache in left ear. Is Patient Diabetic? Yes   CC:  Rm 5   2 month f/u. Has earache in left ear.[facility]  History of Present Illness: Catherine Fleming is a 55 year old female who presents today for follow up of her diabetes.  She has gained 7 pounds since her last visit.  Pt tells me that she recently learned that she will be layed off from her job in the end of October and had been "stress eating."   Notes that her fasting sugars have been around 110 pre-prandial.  Post prandial less than 180. Denies symptomatic hypoglycemia.   Denies polyuria or polydypsia.  March 2011 last eye exam- pt reports that this was done by [facility] and reports that there was no diabetic retinopathy.   Allergies (verified): No Known Drug Allergies  Physical Exam  General:  Morbidly obese white female, awake, altert and in NAD Lungs:  Normal respiratory effort, chest expands symmetrically. Lungs are clear to auscultation, no crackles or wheezes. Heart:  Normal rate and regular rhythm. S1 and S2 normal without gallop, murmur, click, rub or other extra sounds. Psych:  Cognition and judgment appear intact. Alert and cooperative with normal attention span and concentration. No apparent delusions, illusions, hallucinations  Diabetes Management Exam:    Foot Exam (with socks and/or shoes not present):       Sensory-Monofilament:          Left foot: normal          Right foot: normal       Inspection:          Left foot: normal          Right foot: normal       Nails:          Left foot: normal          Right foot: normal    Eye Exam:       Eye Exam done  elsewhere          Date: 12/20/2009          Results: normal          Done by: Dr. Margo Aye, [facility]   Impression & Recommendations:  Problem # 1:  OBESITY, MORBID (ICD-278.01) Assessment Deteriorated Patient was counseled on diet exercise and weight loss.  Instructed patient to keep track of her calories carefully.  Problem # 2:  DM (ICD-250.00) Assessment: Unchanged Stable per history.  Requested eye exam.  Continue current meds.  Check labs as below.  Her updated medication list for this problem includes:    Lisinopril-hydrochlorothiazide 20-25 Mg Tabs (Lisinopril-hydrochlorothiazide) .[facility]... Take 1 tablet by mouth once a day    Glucophage Xr 500 Mg Xr24h-tab (Metformin hcl) ..... One tablet by mouth daily    Aspirin 81 Mg Chew (Aspirin) ..... One tablet by mouth daily  Orders: UA Microalbumin-[facility] (13086) TLB-BMP (Basic Metabolic Panel-BMET) (80048-METABOL)  Labs Reviewed: Creat: 0.67 (  02/26/2010)     Last Eye Exam: normal (12/20/2009) Reviewed HgBA1c results: 6.7 (02/26/2010)  Problem # 3:  HYPERTENSION (ICD-401.9) Assessment: Comment Only Stable and unchanged, continue current meds as below.  Her updated medication list for this problem includes:    Atenolol 100 Mg Tabs (Atenolol) .[facility]... Take 1 tablet by mouth once a day    Lisinopril-hydrochlorothiazide 20-25 Mg Tabs (Lisinopril-hydrochlorothiazide) .[facility]... Take 1 tablet by mouth once a day  Orders: T-Hgb A1C ([address]) TLB-BMP (Basic Metabolic Panel-BMET) (80048-METABOL)  BP today: 126/76 Prior BP: 124/74 (04/25/2010)  Labs Reviewed: K+: 4.3 (02/26/2010) Creat: : 0.67 (02/26/2010)   Chol: 193 (04/25/2010)   HDL: 52 (04/25/2010)   LDL: 103 (04/25/2010)   TG: 190 (04/25/2010)  Complete Medication List: 1)  Livalo 2 Mg Tabs (Pitavastatin calcium) .... One tablet by mouth daily in the evening 2)  Atenolol 100 Mg Tabs (Atenolol) .... Take 1 tablet by mouth once a day 3)  Citalopram Hydrobromide 20 Mg Tabs (Citalopram  hydrobromide) .... One tablet by mouth daily 4)  Lisinopril-hydrochlorothiazide 20-25 Mg Tabs (Lisinopril-hydrochlorothiazide) .... Take 1 tablet by mouth once a day 5)  Glucophage Xr 500 Mg Xr24h-tab (Metformin hcl) .... One tablet by mouth daily 6)  Aspirin 81 Mg Chew (Aspirin) .... One tablet by mouth daily  Patient Instructions: 1)  Please schedule a follow-up appointment in 3 months. 2)  Complete your lab work downstairs today. Prescriptions: CITALOPRAM HYDROBROMIDE 20 MG TABS (CITALOPRAM HYDROBROMIDE) one tablet by mouth daily  #30 x 3   Entered and Authorized by:   Lemont Fillers FNP   Signed by:   Lemont Fillers FNP on 06/18/2010   Method used:   Electronically to        [facility]       [address]       [place], [place]  [address]       Ph: [phone]       Fax: [phone]   RxID:   [id number]   Current Allergies (reviewed today): No known allergies

## 2010-11-06 NOTE — Assessment & Plan Note (Signed)
Summary: ear not better / tf,cma--Rm 5   Vital Signs:  Patient profile:   55 year old female Height:      65 inches Weight:      315.25 pounds BMI:     52.65 O2 Sat:      96 % on Room air Temp:     98.2 degrees F oral Pulse rate:   72 / minute Pulse rhythm:   regular Resp:     18 per minute BP sitting:   128 / 74  (right arm) Cuff size:   large  Vitals Entered By: Mervin Kung CMA Duncan Dull) (August 24, 2010 2:27 PM)  O2 Flow:  Room air CC: Pt states she has brown drainage from her left ear x 2 days. Right ear starting to feel full. Also has cough x 2-3 days. Is Patient Diabetic? Yes Pain Assessment Patient in pain? no        Primary Care Donnarae Rae:  Lemont Fillers FNP  CC:  Pt states she has brown drainage from her left ear x 2 days. Right ear starting to feel full. Also has cough x 2-3 days.[facility]  History of Present Illness: Pt seen in follow up fro 10/26 visit.  She was treated at that time with amoxicilin for L OM.  Notes that L ear pain- only relief was from ear drops.  Amoxicillin did not "touch it."  Denies fever.  Denies chills or malaise.  Feels a little tired.  Chronic sinus pain, yellow nasal discharge.  + cough worse in AM, dry. + post nasal drip.  Can't hear out of the left ear.   Allergies (verified): No Known Drug Allergies  Past History:  Past Medical History: Last updated: 02/26/2010 Diabetes Hypercholesterolemia Colon polyps Diverticulosis  Physical Exam  General:  Well-developed,well-nourished,in no acute distress; alert,appropriate and cooperative throughout examination Head:  Normocephalic and atraumatic without obvious abnormalities. No apparent alopecia or balding. Eyes:  PERRLA Ears:  R TM dull, mild erythema without bulging.  L TM yellow effusion,  + purulent material noted in canal.   Lungs:  Normal respiratory effort, chest expands symmetrically. Lungs are clear to auscultation, no crackles or wheezes. Heart:  Normal rate and  regular rhythm. S1 and S2 normal without gallop, murmur, click, rub or other extra sounds. Psych:  Cognition and judgment appear intact. Alert and cooperative with normal attention span and concentration. No apparent delusions, illusions, hallucinations   Impression & Recommendations:  Problem # 1:  OTITIS MEDIA, ACUTE, LEFT (ICD-382.9) Assessment Deteriorated  Her updated medication list for this problem includes:    Aspirin 81 Mg Chew (Aspirin) ..... One tablet by mouth daily    Avelox Abc Pack 400 Mg Tabs (Moxifloxacin hcl) .[facility]... Take as directed    Avelox 400 Mg Tabs (Moxifloxacin hcl) ..... One tablet by mouth once daily for 7 days  Problem # 2:  OTITIS EXTERNA (ICD-380.10) Assessment: New  Her updated medication list for this problem includes:    Antipyrine-benzocaine 54-14 Mg/ml Soln (Benzocaine-antipyrine) .[facility]... 2-4 drops in ear left ear three times a day as needed for pain    Neomycin-polymyxin-hc 3.5-10000-1 Soln (Neomycin-polymyxin-hc) .[facility] 4 drops in left ear 3 times daily for 1 week or until symptoms resolved  Problem # 3:  SINUSITIS (ICD-473.9) Assessment: New  Her updated medication list for this problem includes:    Avelox Abc Pack 400 Mg Tabs (Moxifloxacin hcl) .[facility]... Take as directed    Avelox 400 Mg Tabs (Moxifloxacin hcl) ..... One tablet by mouth  once daily for 7 days  Complete Medication List: 1)  Livalo 2 Mg Tabs (Pitavastatin calcium) .... One tablet by mouth daily in the evening 2)  Atenolol 100 Mg Tabs (Atenolol) .... Take 1 tablet by mouth once a day 3)  Citalopram Hydrobromide 20 Mg Tabs (Citalopram hydrobromide) .... One tablet by mouth daily 4)  Lisinopril-hydrochlorothiazide 20-25 Mg Tabs (Lisinopril-hydrochlorothiazide) .... Take 1 tablet by mouth once a day 5)  Glucophage Xr 500 Mg Xr24h-tab (Metformin hcl) .... Two tabs by mouth daily 6)  Aspirin 81 Mg Chew (Aspirin) .... One tablet by mouth daily 7)  One Touch Ultra Mini Test Strips  .... Check  blood sugar once a day. 8)  Antipyrine-benzocaine 54-14 Mg/ml Soln (Benzocaine-antipyrine) .... 2-4 drops in ear left ear three times a day as needed for pain 9)  Neomycin-polymyxin-hc 3.5-10000-1 Soln (Neomycin-polymyxin-hc) .... 4 drops in left ear 3 times daily for 1 week or until symptoms resolved 10)  Avelox Abc Pack 400 Mg Tabs (Moxifloxacin hcl) .... Take as directed 11)  Avelox 400 Mg Tabs (Moxifloxacin hcl) .... One tablet by mouth once daily for 7 days  Patient Instructions: 1)  Call if symptoms worsen or do not improve.  Prescriptions: AVELOX 400 MG TABS (MOXIFLOXACIN HCL) one tablet by mouth once daily for 7 days  #6 x 0   Entered and Authorized by:   Lemont Fillers FNP   Signed by:   Lemont Fillers FNP on 08/24/2010   Method used:   Samples Given   RxID:   [id number] AVELOX ABC PACK 400 MG TABS (MOXIFLOXACIN HCL) take as directed  #1 x 0   Entered and Authorized by:   Lemont Fillers FNP   Signed by:   Lemont Fillers FNP on 08/24/2010   Method used:   Print then Give to Patient   RxID:   [id number] NEOMYCIN-POLYMYXIN-HC 3.5-10000-1 SOLN (NEOMYCIN-POLYMYXIN-HC) 4 drops in left ear 3 times daily for 1 week or until symptoms resolved  #1 x 0   Entered and Authorized by:   Lemont Fillers FNP   Signed by:   Lemont Fillers FNP on 08/24/2010   Method used:   Electronically to        [facility]       [address]       [place], [place]  [address]       Ph: [phone]       Fax: [phone]   RxID:   [id number]    Orders Added: 1)  Est. Patient Level II [[address]]    Current Allergies (reviewed today): No known allergies

## 2010-11-06 NOTE — Miscellaneous (Signed)
Summary: eye exam 2011  Clinical Lists Changes  Observations: Added new observation of DMEYEEXAMNXT: 03/2011 (06/19/2010 11:52) Added new observation of DMEYEEXMRES: no diabetic retinopathy (02/19/2010 11:56) Added new observation of EYE EXAM BY: [facility]  (02/19/2010 11:56) Added new observation of DIAB EYE EX: no diabetic retinopathy (02/19/2010 11:56)          Diabetes Management Exam:    Eye Exam:       Eye Exam done elsewhere          Date: 02/19/2010          Results: no diabetic retinopathy          Done by: [facility]

## 2010-11-06 NOTE — Assessment & Plan Note (Signed)
Summary: 1 month follow up/mhf--Rm 5   Vital Signs:  Patient profile:   55 year old female Height:      65 inches Weight:      311.50 pounds BMI:     52.02 Temp:     98.1 degrees F oral Pulse rate:   66 / minute Pulse rhythm:   regular Resp:     18 per minute BP sitting:   124 / 74  (right arm) Cuff size:   thigh  Vitals Entered By: Mervin Kung CMA Duncan Dull) (April 25, 2010 9:58 AM) CC: Room 5  1 month follow up. Needs refills on: Atenolol, fluoxetine, Lisinopril-hctz & Glucophage XR. Is Patient Diabetic? Yes   CC:  Room 5  1 month follow up. Needs refills on: Atenolol, fluoxetine, and Lisinopril-hctz & Glucophage XR.Marland Kitchen  History of Present Illness: Catherine Fleming is a 55 year old female who presents today for follow up.  1)Hyperlipidemia-Previously on simvastatin, this was changed to Livalo due to muscle pain.  Since starting Livalo, pt  notes that she has not had any muscle or GI issues. She is very pleased with the change.  2)DM-  Notes that her sugars are about 100-120. Stable.  3) Hx of abnormal colonoscopy- reports that she had a large polyp removed from the junction of the large and small intestine.  Colo was repeated 6 months later and she had some additional smaller polyps removed.  She was told that she would need a follow up colo in September (done by [facility]).     Allergies (verified): No Known Drug Allergies  Physical Exam  General:  Morbidly obese white female, awake, altert and in NAD Head:  Normocephalic and atraumatic without obvious abnormalities. No apparent alopecia or balding. Lungs:  Normal respiratory effort, chest expands symmetrically. Lungs are clear to auscultation, no crackles or wheezes. Heart:  Normal rate and regular rhythm. S1 and S2 normal without gallop, murmur, click, rub or other extra sounds. Extremities:  no peripheral edema noted.   Impression & Recommendations:  Problem # 1:  HYPERLIPIDEMIA  (ICD-272.4) Assessment Comment Only Pt is tolerating livalo. Will repeat lipids and LFT's.  Her updated medication list for this problem includes:    Livalo 2 Mg Tabs (Pitavastatin calcium) ..... One tablet by mouth daily in the evening  Orders: TLB-Lipid Panel (80061-LIPID) TLB-Hepatic/Liver Function Pnl (80076-HEPATIC)  Problem # 2:  COLONIC POLYPS, HX OF (ICD-V12.72) Assessment: Comment Only Will refer to [facility] as they accept her insurance- for further evaluation and f/u colonoscopy. Orders: Gastroenterology Referral (GI)  Problem # 3:  DM (ICD-250.00) Stable, A1C is at goal.  Will add a baby aspirin for cardiac protection. Her updated medication list for this problem includes:    Lisinopril-hydrochlorothiazide 20-25 Mg Tabs (Lisinopril-hydrochlorothiazide) .Marland Kitchen... Take 1 tablet by mouth once a day    Glucophage Xr 500 Mg Xr24h-tab (Metformin hcl) ..... One tablet by mouth daily    Aspirin 81 Mg Chew (Aspirin) ..... One tablet by mouth daily  Labs Reviewed: Creat: 0.67 (02/26/2010)    Reviewed HgBA1c results: 6.7 (02/26/2010)  Complete Medication List: 1)  Livalo 2 Mg Tabs (Pitavastatin calcium) .... One tablet by mouth daily in the evening 2)  Atenolol 100 Mg Tabs (Atenolol) .... Take 1 tablet by mouth once a day 3)  Fluoxetine Hcl 20 Mg Caps (Fluoxetine hcl) .... Take 1 capsule by mouth once a day 4)  Lisinopril-hydrochlorothiazide 20-25 Mg Tabs (Lisinopril-hydrochlorothiazide) .... Take 1 tablet by mouth once a  day 5)  Glucophage Xr 500 Mg Xr24h-tab (Metformin hcl) .... One tablet by mouth daily 6)  Aspirin 81 Mg Chew (Aspirin) .... One tablet by mouth daily  Patient Instructions: 1)  Please complete your labs downstairs today. 2)  Follow up in September. Prescriptions: GLUCOPHAGE XR 500 MG XR24H-TAB (METFORMIN HCL) one tablet by mouth daily  #90 x 1   Entered and Authorized by:   Lemont Fillers FNP   Signed by:   Lemont Fillers FNP on 04/25/2010    Method used:   Electronically to        [facility]ail[facility]       [address]       [place], [place]  [address]       Ph: [phone]       Fax: [phone]   RxID:   [id number] LISINOPRIL-HYDROCHLOROTHIAZIDE 20-25 MG TABS (LISINOPRIL-HYDROCHLOROTHIAZIDE) Take 1 tablet by mouth once a day  #90 x 1   Entered and Authorized by:   Lemont Fillers FNP   Signed by:   Lemont Fillers FNP on 04/25/2010   Method used:   Electronically to        [facility]       [address]       [place], [place]  [address]       Ph: [phone]       Fax: [phone]   RxID:   [id number] FLUOXETINE HCL 20 MG CAPS (FLUOXETINE HCL) Take 1 capsule by mouth once a day  #90 x 1   Entered and Authorized by:   Lemont Fillers FNP   Signed by:   Lemont Fillers FNP on 04/25/2010   Method used:   Electronically to        [facility]       [address]       [place], [place]  [address]       Ph: [phone]       Fax: [phone]   RxID:   [id number] ATENOLOL 100 MG TABS (ATENOLOL) Take 1 tablet by mouth once a day  #90 x 1   Entered and Authorized by:   Lemont Fillers FNP   Signed by:   Lemont Fillers FNP on 04/25/2010   Method used:   Electronically to        [facility])       [address]       [place], [place]  [address]       Ph: [phone]       Fax: [phone]   RxID:   [id number]   Current Allergies (reviewed today): No known allergies

## 2010-11-08 NOTE — Progress Notes (Signed)
Summary: Sinus Infection  Phone Note Call from Patient Call back at Work Phone [phone]   Caller: Patient Call For: Lemont Fillers FNP Summary of Call: Patient called with c/o  nasal drainage dark brown, denies fever, trouble sleeping, productive cough dark brown in color, sinus congestion, and  denies breathing difficulty ongoing for the past week. She was advised to schedule appointment for evaluation, however she states she does not have any insurance. She states Efraim Kaufmann is aware of her circumstances. She would like to know if a Z-Pak could be sent to the pharmacy for her. Initial call taken by: Glendell Docker CMA,  October 23, 2010 9:04 AM  Follow-up for Phone Call        I would recommend doxycycline.  Will send to pharmacy.  Pt will need to be seen if symptoms worsen, or if no improvement in 48 hours.   Follow-up by: Lemont Fillers FNP,  October 23, 2010 9:45 AM  Additional Follow-up for Phone Call Additional follow up Details #1::        Pt notified. Nicki Guadalajara Fergerson CMA Duncan Dull)  October 23, 2010 10:57 AM     New/Updated Medications: DOXYCYCLINE HYCLATE 100 MG CAPS (DOXYCYCLINE HYCLATE) one cap by mouth two times a day for 10 days Prescriptions: DOXYCYCLINE HYCLATE 100 MG CAPS (DOXYCYCLINE HYCLATE) one cap by mouth two times a day for 10 days  #20 x 0   Entered and Authorized by:   Lemont Fillers FNP   Signed by:   Lemont Fillers FNP on 10/23/2010   Method used:   [facility]ron[facility] to        [facility]       [address]       [place], [place]  [address]       Ph: [phone]       Fax: [phone]   RxID:   [id number]

## 2010-11-08 NOTE — Assessment & Plan Note (Signed)
Summary: 3 month follow up/mhf--Rm 4   Vital Signs:  Patient profile:   55 year old female Height:      65 inches Weight:      314 pounds BMI:     52.44 Temp:     97.8 degrees F oral Pulse rate:   60 / minute Pulse rhythm:   regular Resp:     18 per minute BP sitting:   136 / 88  (right arm) Cuff size:   large  Vitals Entered By: Mervin Kung CMA Duncan Dull) (September 17, 2010 9:33 AM) CC: Pt here for 3 month f/u. Ear symptoms are returning. Not able to continue Livalo due to cost. Is Patient Diabetic? Yes Pain Assessment Patient in pain? no      Comments Pt has completed Avelox. Pt agrees all other meds and doses are correct. Nicki Guadalajara Fergerson CMA Duncan Dull)  September 17, 2010 9:39 AM    [facility]:  Lemont Fillers FNP  CC:  Pt here for 3 month f/u. Ear symptoms are returning. Not able to continue Livalo due to cost..  History of Present Illness: Ms Strzelecki is a 55 year old female who presents today for follow up.  1) Left ear pain and drainage.  Initially improved after antibiotics.  Also had some bloody drainage.    2)DM2-  Fasting sugars 120-150, post prandial generally under 200.   3) Hyperlipidemia- tolerating livalo- cost was a concern.   Allergies (verified): No Known Drug Allergies  Past History:  Past Medical History: Last updated: 02/26/2010 Diabetes Hypercholesterolemia Colon polyps Diverticulosis  Past Surgical History: Last updated: 02/26/2010 Right Breast Biopsy--1991  Review of Systems       se HPI  Physical Exam  General:  Well-developed,well-nourished,in no acute distress; alert,appropriate and cooperative throughout examination Head:  Normocephalic and atraumatic without obvious abnormalities. No apparent alopecia or balding. Eyes:  PERRLA, sclera are clear no injection Ears:  R RM normal.  L Canal is swollen, erythematous and noted to have purulent drainage in canal.  TM is obscured by drainage. Mouth:  Oral mucosa and  oropharynx without lesions or exudates.  Teeth in good repair. Neck:  No deformities, masses, or tenderness noted. Lungs:  Normal respiratory effort, chest expands symmetrically. Lungs are clear to auscultation, no crackles or wheezes. Heart:  Normal rate and regular rhythm. S1 and S2 normal without gallop, murmur, click, rub or other extra sounds.  Diabetes Management Exam:    Foot Exam (with socks and/or shoes not present):       Inspection:          Left foot: normal          Right foot: normal       Nails:          Left foot: normal          Right foot: normal   Impression & Recommendations:  Problem # 1:  OTITIS EXTERNA (ICD-380.10) Assessment Deteriorated Pt was instructed to start neomycin-polymyxin.  Will also plan to treat with Augmentin for coverage of any underlying middle ear infection.  Unable to visualize the TM despite lavage of ear canal.     Her updated medication list for this problem includes:    Antipyrine-benzocaine 54-14 Mg/ml Soln (Benzocaine-antipyrine) .Marland Kitchen... 2-4 drops in ear left ear three times a day as needed for pain    Neomycin-polymyxin-hc 3.5-10000-1 Soln (Neomycin-polymyxin-hc) .[facility] Kitchen 4 drops in left ear 3 times daily for 1 week or until symptoms resolved  Problem #  2:  DM (ICD-250.00) CBG's appear stable.  Will check A1C- was 7.4 last visit.  Her updated medication list for this problem includes:    Lisinopril-hydrochlorothiazide 20-25 Mg Tabs (Lisinopril-hydrochlorothiazide) .Marland Kitchen... Take 1 tablet by mouth once a day    Glucophage Xr 500 Mg Xr24h-tab (Metformin hcl) .Marland Kitchen..Marland Kitchen Two tabs by mouth daily    Aspirin 81 Mg Chew (Aspirin) ..... One tablet by mouth daily  Orders: Glucose, (CBG) [phone]) T-Hgb A1C [phone])  Labs Reviewed: Creat: 0.70 (06/18/2010)     Last Eye Exam: no diabetic retinopathy (02/19/2010) Reviewed HgBA1c results: 7.4 (06/18/2010)  6.7 (02/26/2010)  Problem # 3:  HYPERLIPIDEMIA (ICD-272.4) Assessment: Unchanged Samples  given of 4mg  tabs (#7) pt instructed to take 1/2 tablet once daily.   She will try again to fill rx with coupon- now that she no longer has insurance. lot [id number] exp 6/12 Her updated medication list for this problem includes:    Livalo 2 Mg Tabs (Pitavastatin calcium) ..... One tablet by mouth daily in the evening  Complete Medication List: 1)  Livalo 2 Mg Tabs (Pitavastatin calcium) .... One tablet by mouth daily in the evening 2)  Atenolol 100 Mg Tabs (Atenolol) .... Take 1 tablet by mouth once a day 3)  Citalopram Hydrobromide 20 Mg Tabs (Citalopram hydrobromide) .... One tablet by mouth daily 4)  Lisinopril-hydrochlorothiazide 20-25 Mg Tabs (Lisinopril-hydrochlorothiazide) .... Take 1 tablet by mouth once a day 5)  Glucophage Xr 500 Mg Xr24h-tab (Metformin hcl) .... Two tabs by mouth daily 6)  Aspirin 81 Mg Chew (Aspirin) .... One tablet by mouth daily 7)  One Touch Ultra Mini Test Strips  .... Check blood sugar once a day. 8)  Antipyrine-benzocaine 54-14 Mg/ml Soln (Benzocaine-antipyrine) .... 2-4 drops in ear left ear three times a day as needed for pain 9)  Neomycin-polymyxin-hc 3.5-10000-1 Soln (Neomycin-polymyxin-hc) .... 4 drops in left ear 3 times daily for 1 week or until symptoms resolved 10)  Augmentin 875-125 Mg Tabs (Amoxicillin-pot clavulanate) .... One tab by mouth two times a day for 10 days  Patient Instructions: 1)  Please complete your lab work on the first floor. 2)  Call if your ear pain, worsens, or if it is not resolved when you complete your antibiotics. 3)  Have a nice Holiday! 4)  Follow up in 3 months. Prescriptions: NEOMYCIN-POLYMYXIN-HC 3.5-10000-1 SOLN (NEOMYCIN-POLYMYXIN-HC) 4 drops in left ear 3 times daily for 1 week or until symptoms resolved  #1 x 0   Entered and Authorized by:   Lemont Fillers FNP   Signed by:   Lemont Fillers FNP on 09/17/2010   Method used:   Electronically to        [facility]        [address]       [place], [place]  [address]       Ph: [phone]       Fax: [phone]   RxID:   [id number] AUGMENTIN 875-125 MG TABS (AMOXICILLIN-POT CLAVULANATE) one tab by mouth two times a day for 10 days  #20 x 0   Entered and Authorized by:   Lemont Fillers FNP   Signed by:   Lemont Fillers FNP on 09/17/2010   Method used:   Electronically to        [facility] ([facility]ail)       [address]       [place]  [address]       [place], [place]  [address]       Ph: [phone]       Fax: [phone]   RxID:   [id number] LIVALO 2 MG TABS (PITAVASTATIN CALCIUM) one tablet by mouth daily in the evening  #30 x 2   Entered and Authorized by:   Lemont Fillers FNP   Signed by:   Lemont Fillers FNP on 09/17/2010   Method used:   Print then Give to Patient   RxID:   [id number]    Orders Added: 1)  Glucose, (CBG) [82962] 2)  T-Hgb A1C [[address]36-23375] 3)  Est. Patient Level II [[address]]     Current Allergies (reviewed today): No known allergies   Laboratory Results   Blood Tests    Date/Time Reported: Mervin Kung CMA (AAMA)  September 17, 2010 9:45 AM   CBG Fasting:: 143mg /dL

## 2010-11-08 NOTE — Progress Notes (Signed)
Summary: lab result  Phone Note Outgoing Call   Summary of Call: Please call patient and let her know that her A1C is not yet at goal. (7.3, goal under 7.0)  She should keep working hard on diet, exercise, and weight loss.  Also,  I would like for her to increase her metformin to 3  tabs by mouth once daily as below.   Initial call taken by: Lemont Fillers FNP,  September 18, 2010 10:53 AM  Follow-up for Phone Call        Left message with pt's sister to have pt return my call. Nicki Guadalajara Fergerson CMA Duncan Dull)  September 18, 2010 11:00 AM   Left message on machine to return my call. Nicki Guadalajara Fergerson CMA Duncan Dull)  September 19, 2010 1:39 PM   Additional Follow-up for Phone Call Additional follow up Details #1::        Pt notified and states she has already picked up rx. Nicki Guadalajara Fergerson CMA Duncan Dull)  September 20, 2010 9:14 AM     New/Updated Medications: GLUCOPHAGE XR 500 MG XR24H-TAB (METFORMIN HCL) 3  tabs by mouth once daily Prescriptions: GLUCOPHAGE XR 500 MG XR24H-TAB (METFORMIN HCL) 3  tabs by mouth once daily  #90 x 2   Entered and Authorized by:   Lemont Fillers FNP   Signed by:   Lemont Fillers FNP on 09/18/2010   Method used:   Electronically to        [facility]       [address]       [place], [place]  [address]       Ph: [phone]       Fax: [phone]   RxID:   [id number]

## 2010-12-18 ENCOUNTER — Ambulatory Visit: Payer: Self-pay | Admitting: Family

## 2011-01-12 LAB — CBC
HCT: 43.9 % (ref 36.0–46.0)
Hemoglobin: 14.6 g/dL (ref 12.0–15.0)
MCHC: 33.2 g/dL (ref 30.0–36.0)
MCV: 90.3 fL (ref 78.0–100.0)
Platelets: 297 10*3/uL (ref 150–400)
RBC: 4.86 MIL/uL (ref 3.87–5.11)
RDW: 12.7 % (ref 11.5–15.5)
WBC: 9.5 10*3/uL (ref 4.0–10.5)

## 2011-01-12 LAB — URINALYSIS, ROUTINE W REFLEX MICROSCOPIC
Bilirubin Urine: NEGATIVE
Glucose, UA: NEGATIVE mg/dL
Hgb urine dipstick: NEGATIVE
Ketones, ur: NEGATIVE mg/dL
Nitrite: NEGATIVE
Protein, ur: NEGATIVE mg/dL
Specific Gravity, Urine: 1.024 (ref 1.005–1.030)
Urobilinogen, UA: 0.2 mg/dL (ref 0.0–1.0)
pH: 6 (ref 5.0–8.0)

## 2011-01-12 LAB — BASIC METABOLIC PANEL
BUN: 17 mg/dL (ref 6–23)
CO2: 29 mEq/L (ref 19–32)
Calcium: 9.7 mg/dL (ref 8.4–10.5)
Chloride: 103 mEq/L (ref 96–112)
Creatinine, Ser: 0.7 mg/dL (ref 0.4–1.2)
GFR calc Af Amer: >60 mL/min (ref >60)
GFR calc non Af Amer: >60 mL/min (ref >60)
Glucose, Bld: 116 mg/dL — ABNORMAL HIGH (ref 70–99)
Potassium: 3.9 mEq/L (ref 3.5–5.1)
Sodium: 140 mEq/L (ref 135–145)

## 2011-01-12 LAB — DIFFERENTIAL
Basophils Absolute: 0.1 10*3/uL (ref 0.0–0.1)
Basophils Relative: 1 % (ref 0–1)
Eosinophils Absolute: 0.2 10*3/uL (ref 0.0–0.7)
Eosinophils Relative: 2 % (ref 0–5)
Lymphocytes Relative: 20 % (ref 12–46)
Lymphs Abs: 1.9 10*3/uL (ref 0.7–4.0)
Monocytes Absolute: 0.6 10*3/uL (ref 0.1–1.0)
Monocytes Relative: 7 % (ref 3–12)
Neutro Abs: 6.7 10*3/uL (ref 1.7–7.7)
Neutrophils Relative %: 70 % (ref 43–77)

## 2011-01-12 LAB — URINE MICROSCOPIC-ADD ON

## 2011-02-15 ENCOUNTER — Other Ambulatory Visit: Payer: Self-pay | Admitting: Family

## 2011-02-18 ENCOUNTER — Other Ambulatory Visit: Payer: Self-pay | Admitting: Family

## 2011-02-18 NOTE — Telephone Encounter (Signed)
Pt was changed to Citalopram in 06/2010. Pt is past due for follow up. Left message on machine for pt to return my call to verify med request and schedule f/u.

## 2011-02-19 NOTE — Telephone Encounter (Signed)
Left message on machine for pt to return my call  

## 2011-02-20 NOTE — Telephone Encounter (Signed)
Left detailed message on pt's cell to call re: rx request and verification.

## 2011-02-20 NOTE — Telephone Encounter (Signed)
OK to give 30 tabs with 2 refills of fluoxetine 20mg .

## 2011-02-20 NOTE — Telephone Encounter (Signed)
Pt returned my call and states that she was changed back to Prozac at her last visit because the Citalopram was not helping. She states we did not give her a Rx for the Prozac because she still had some. Pt states she will have to call back to schedule appt as she is without ins. And will have to arrange for funds to be seen. Please advise re: Prozac request.

## 2011-02-21 NOTE — Telephone Encounter (Signed)
Left message on cell# to return my call. 

## 2011-02-22 NOTE — Telephone Encounter (Signed)
Left detailed message on cell# re: rx authorization, need for appt before additional refills expire and to call if any questions.

## 2011-02-22 NOTE — H&P (Signed)
[facility]  Patient:    Catherine Fleming, Catherine Fleming                       MRN: [id number] Adm. Date:  07/31/00 Attending:  Sung Amabile. Roslyn Smiling, M.D.                         History and Physical  DATE OF BIRTH:                Mar 13, 1956  CHIEF COMPLAINT:              Irregular bleeding despite Depo-Provera and thickened endometrium on ultrasound.  HISTORY OF PRESENT ILLNESS:   A 55 year old woman, G0, using Depo-Provera since January of this year with irregular bleeding despite receiving medication every 11 weeks.  Workup has included a pelvic ultrasound in late August which showed a slightly thickened endometrium as well as uterine fibroids.  Endometrial biopsy was performed before Depo-Provera was initiated in December of last year and revealed benign proliferative-type endometrium with breakdown.                                The patient is morbidly obese and, because of the thickened endometrium by ultrasound, previously normal biopsy and bleeding despite ongoing Depo-Provera use, hysteroscopy has been suggested to rule out endometrial pathology.  PAST MEDICAL HISTORY:         Medical history of sinus infection, probable endometriosis, history of depression.  PAST SURGICAL HISTORY:        Right breast cystectomy.  MEDICATIONS:                  Prozac, Claritin, Anaprox, Aciphex.  ALLERGIES:                    None.  FAMILY HISTORY:               Mother and father both died of myocardial infarction.  Both had history of hypertension.  Mother with history of Graves disease.  Brother died of myocardial infarction at age 54.  Another brother with hypertension and adult-onset diabetes mellitus.  SOCIAL HISTORY:               Single.  Works at Clinical biochemist for [employer].  Denies tobacco use, uses alcohol socially.  PHYSICAL EXAMINATION:  GENERAL:                      Morbidly obese woman, blood pressure 136/82, weight 103.6.  VITAL  SIGNS:                  Stable.  HEENT:                        Within normal limits.  NECK:                         Without thyromegaly.  CHEST:                        Clear.  COR:                          Regular rate and rhythm.  S1, S2 normal.  BREASTS:  Without masses.  ABDOMEN:                      Soft, nontender, without organomegaly, mass, or hernia.  BACK:                         Without CVAT.  GENITOURINARY:                External genitalia, BUS, and vagina without lesions.  Cervix without lesion.  Uterus anteverted, normal size, nontender, and mobile.  Adnexa normal to palpation.  RECTOVAGINAL:                 Exam confirmatory.  EXTREMITIES:                  Without CCE.  SKIN:                         Without lesions.  NEUROLOGIC:                   Grossly intact.  LABORATORY DATA:              Pap within normal limits, September 2001.  IMPRESSION:                   1. Abnormal uterine bleeding, thickened                                  endometrium - rule out endometrial pathology.                               2. Fibroids.                               3. History of symptoms consistent with                                  endometriosis.                               4. Morbid obesity.  PLAN:                         Operative hysteroscopy and dilation and curettage.  The patient has been counseled regarding the benefits, risks, and options of this procedure prior to surgery.  Questions have been answered and consent has been obtained. DD:  07/30/00 TD:  07/30/00 Job: [id number]

## 2018-03-25 DIAGNOSIS — I10 Essential (primary) hypertension: Secondary | ICD-10-CM | POA: Diagnosis not present

## 2018-03-25 DIAGNOSIS — E7800 Pure hypercholesterolemia, unspecified: Secondary | ICD-10-CM | POA: Diagnosis not present

## 2018-03-25 DIAGNOSIS — Z7984 Long term (current) use of oral hypoglycemic drugs: Secondary | ICD-10-CM | POA: Diagnosis not present

## 2018-03-25 DIAGNOSIS — E1165 Type 2 diabetes mellitus with hyperglycemia: Secondary | ICD-10-CM | POA: Diagnosis not present

## 2018-03-25 DIAGNOSIS — Z6841 Body Mass Index (BMI) 40.0 and over, adult: Secondary | ICD-10-CM | POA: Diagnosis not present

## 2018-08-28 DIAGNOSIS — M25551 Pain in right hip: Secondary | ICD-10-CM | POA: Diagnosis not present

## 2018-09-23 DIAGNOSIS — I10 Essential (primary) hypertension: Secondary | ICD-10-CM | POA: Diagnosis not present

## 2018-09-23 DIAGNOSIS — E119 Type 2 diabetes mellitus without complications: Secondary | ICD-10-CM | POA: Diagnosis not present

## 2018-09-23 DIAGNOSIS — E559 Vitamin D deficiency, unspecified: Secondary | ICD-10-CM | POA: Diagnosis not present

## 2020-10-05 ENCOUNTER — Encounter: Payer: Self-pay | Admitting: Physical Therapy

## 2020-10-05 ENCOUNTER — Ambulatory Visit: Payer: Medicare Other | Attending: Nurse Practitioner | Admitting: Physical Therapy

## 2020-10-05 ENCOUNTER — Other Ambulatory Visit: Payer: Self-pay

## 2020-10-05 DIAGNOSIS — M6281 Muscle weakness (generalized): Secondary | ICD-10-CM

## 2020-10-05 DIAGNOSIS — R2689 Other abnormalities of gait and mobility: Secondary | ICD-10-CM

## 2020-10-05 DIAGNOSIS — R2681 Unsteadiness on feet: Secondary | ICD-10-CM

## 2020-10-05 NOTE — Patient Instructions (Addendum)
   SIT TO STAND: No Device    Sit with feet shoulder-width apart, on floor. Lean chest forward, raise hips up from surface. Straighten hips and knees. Weight bear equally on left and right sides. _10__ reps per set, _1-2_ sets per day, _5__ days per week Place left leg closer to sitting surface.  .    Hip Abduction: Modified    Lying on right side with pillow between thighs, raise top leg from pillow, rotating slightly out. Repeat __10__ times per set. Do __1-2__ sets per session. Do _1___ sessions per day.  Try to hold for 3 - 5 secs  http://orth.exer.us/705   Copyright  VHI. All rights reserved.    Straight Leg Raise    Tighten stomach and slowly raise locked right leg _10___ inches from floor. Repeat _10___ times per set. Do _1-2___ sets per session. Do __1__ sessions per day.  Try to hold for 3-5 secs  http://orth.exer.us/1103   Copyright  VHI. All rights reserved.

## 2020-10-06 ENCOUNTER — Encounter: Payer: Self-pay | Admitting: Physical Therapy

## 2020-10-06 NOTE — Therapy (Signed)
[facility] [address] [place], [place], [address] Phone: [phone]   Fax:  [phone]  Physical Therapy Evaluation  Patient Details  Name: Catherine Fleming MRN: [id number] Date of Birth: Jul 07, 1956 Referring Provider (PT): Azzie Roup, FNP;  cc: Dr. Royston Sinner Corrington   Encounter Date: 10/05/2020   PT End of Session - 10/06/20 1522    Visit Number 1    Number of Visits 6    Date for PT Re-Evaluation 11/10/20    Authorization Type UHC Medicare    Authorization Time Period 10-05-20 - 12-04-20    PT Start Time 1425    PT Stop Time 1520    PT Time Calculation (min) 55 min    Activity Tolerance Patient tolerated treatment well    Behavior During Therapy [facility] for tasks assessed/performed           History reviewed. No pertinent past medical history.  History reviewed. No pertinent surgical history.  There were no vitals filed for this visit.    Subjective Assessment - 10/05/20 1423    Subjective Pt presents for PT eval using rollator, which she says she has used for approx. the past year - uses inside and outside the home; has a hurry cane which she has used much less because it is not as stable and she has a big fear of falling; uses motorized scooter in the store when available; Pt is referred to PT by Azzie Roup, FNP, whom she states has [place] of [place] since PT referral was made in Oct. 2021.  Pt is referred to PT for evaluation of mobility, recommendations for most appropriate assistive device with amb. and home safety due to pt being unsteady with gait and also due to her significant fear of falling.    Pertinent History has radiating pain in Lt hip from hip to groin and sometimes extends down thigh; osteopenia per pt report;  DM:  obesity:  HTN    Patient Stated Goals Possibly wants to do aquatic therapy; wants to get off the rollator - wants to walk short distances in her home without the rollator     Currently in Pain? No/denies   takes Tylenol and Alleve and states that has helped the pain             [facility] PT Assessment - 10/06/20 0001      Assessment   Medical Diagnosis Gait instability    Referring Provider (PT) Azzie Roup, FNP;  cc: Dr. Royston Sinner Corrington      Precautions   Precautions Fall      Balance Screen   Has the patient fallen in the past 6 months No    Has the patient had a decrease in activity level because of a fear of falling?  Yes    Is the patient reluctant to leave their home because of a fear of falling?  No      Home Environment   Living Environment Private residence    Type of Home House    Home Access Level entry    Home Layout One level    Home Equipment [place] - single point;Walker - 4 wheels;Other (comment)   Gilmer Mor is a Hurrycane; has a lift chair     Prior Function   Level of Independence Independent with basic ADLs;Independent with household mobility with device;Independent with community mobility with device      ROM / Strength   AROM / PROM / Strength Strength  Strength   Overall Strength Deficits    Strength Assessment Site Hip    Right/Left Hip Right;Left    Right Hip Flexion 4/5   c/o pain with resistance   Right Hip ABduction 3/5    Left Hip Flexion 3+/5    Left Hip ABduction 4-/5   c/o pain with resistance     Ambulation/Gait   Gait velocity 11.63 secs = 2.75 ft/sec with rollator      Standardized Balance Assessment   Standardized Balance Assessment Berg Balance Test      Berg Balance Test   Sit to Stand Able to stand  independently using hands    Standing Unsupported Able to stand safely 2 minutes    Sitting with Back Unsupported but Feet Supported on Floor or Stool Able to sit safely and securely 2 minutes    Stand to Sit Controls descent by using hands    Transfers Able to transfer safely, definite need of hands    Standing Unsupported with Eyes Closed Able to stand 10 seconds with supervision    Standing Unsupported  with Feet Together Needs help to attain position but able to stand for 30 seconds with feet together    From Standing, Reach Forward with Outstretched Arm Can reach confidently >25 cm (10")    From Standing Position, Pick up Object from Floor Unable to try/needs assist to keep balance    From Standing Position, Turn to Look Behind Over each Shoulder Looks behind one side only/other side shows less weight shift    Turn 360 Degrees Needs close supervision or verbal cueing   > weight shift to Rt side   Standing Unsupported, Alternately Place Feet on Step/Stool Needs assistance to keep from falling or unable to try    Standing Unsupported, One Foot in Front Able to take small step independently and hold 30 seconds    Standing on One Leg Unable to try or needs assist to prevent fall    Total Score 31      Timed Up and Go Test   Normal TUG (seconds) 17.72   with rollator:  15.97 secs without rollator                     Objective measurements completed on examination: See above findings.               PT Education - 10/06/20 1517    Education Details educated in eval results; initiated HEP for LE strengthening - see pt instructions    Person(s) Educated Patient    Methods Explanation;Demonstration;Handout    Comprehension Verbalized understanding;Returned demonstration               PT Long Term Goals - 10/06/20 1532      PT LONG TERM GOAL #1   Title Pt will increase Berg balance test score from 31/56 to 38/56 for reduced fall risk.    Baseline 31/56    Time 6    Period Weeks    Status New    Target Date 11/10/20      PT LONG TERM GOAL #2   Title Improve TUG score from 15.97 secs without rollator to </= 13.5 secs with no device for reduced fall risk.    Baseline 15.97 secs without rollator    Time 6    Period Weeks    Status New      PT LONG TERM GOAL #3   Title Pt will demo increased strength in bil.  LE's so she is able to perform sit to stand  transfer from standard chair with use of 1 arm rest only for assistance with standing.    Baseline bil. UE support needed    Time 6    Period Weeks    Status New    Target Date 11/10/20      PT LONG TERM GOAL #4   Title Pt will amb. 65' without device without LOB modified independently for household amb.    Time 6    Period Weeks    Status New    Target Date 11/10/20      PT LONG TERM GOAL #5   Title Independent in HEP including aquatic exercises.    Time 6    Period Weeks    Status New    Target Date 11/10/20                  Plan - 10/06/20 1524    Clinical Impression Statement Pt is a 64 yr old female with c/o bil. hip pain, morbid obesity, gait instability, balance deficits, and LE weakness.  Pt is at fall risk per Berg score of 31/56 and TUG score of 17.72 secs with use of rollator.  Pt using rollator for household and community ambulation.  Pt will benefit from skiled PT to address LE weakness, gait and balance deficits.    Personal Factors and Comorbidities Fitness;Comorbidity 2;Time since onset of injury/illness/exacerbation    Comorbidities HTN, DM, morbid obesity, osteopenia (per pt report), bil. hip pain - pt states need for hip surgery in the next year    Examination-Activity Limitations Locomotion Level;Transfers;Squat;Stairs;Stand;Lift;Bend    Examination-Participation Restrictions Meal Prep;Cleaning;Community Activity;Shop;Laundry    Stability/Clinical Decision Making Evolving/Moderate complexity    Clinical Decision Making Moderate    Rehab Potential Good    PT Frequency 1x / week    PT Duration 6 weeks    PT Treatment/Interventions ADLs/Self Care Home Management;Aquatic Therapy;DME Instruction;Gait training;Stair training;Therapeutic activities;Therapeutic exercise;Balance training;Neuromuscular re-education;Patient/family education    PT Next Visit Plan check HEP given on 10-05-20:  continue with balance and strengthening    PT Home Exercise Plan  strengthening exs given on 10-05-20:  plan to add balance exercises to HEP    Recommended Other Services aquatic therapy    Consulted and Agree with Plan of Care Patient           Patient will benefit from skilled therapeutic intervention in order to improve the following deficits and impairments:  Difficulty walking,Decreased activity tolerance,Decreased balance,Decreased strength,Decreased range of motion,Obesity,Pain  Visit Diagnosis: Other abnormalities of gait and mobility - Plan: PT plan of care cert/re-cert  Muscle weakness (generalized) - Plan: PT plan of care cert/re-cert  Unsteadiness on feet - Plan: PT plan of care cert/re-cert     Problem List Patient Active Problem List   Diagnosis Date Noted  . COLONIC POLYPS, HX OF 04/25/2010  . OBESITY, MORBID 03/28/2010  . DM 02/26/2010  . HYPERLIPIDEMIA 02/26/2010  . HYPERTENSION 02/26/2010  . ABNORMAL ELECTROCARDIOGRAM 02/26/2010    Kary Kos, PT 10/06/2020, 3:48 PM  [facility] [address] [place], [place], [address] Phone: [phone]   Fax:  [phone]  Name: Vern Prestia MRN: [id number] Date of Birth: Sep 01, 1956

## 2020-10-12 ENCOUNTER — Other Ambulatory Visit: Payer: Self-pay

## 2020-10-12 ENCOUNTER — Ambulatory Visit: Payer: Medicare Other | Attending: Nurse Practitioner | Admitting: Physical Therapy

## 2020-10-12 DIAGNOSIS — R2681 Unsteadiness on feet: Secondary | ICD-10-CM | POA: Insufficient documentation

## 2020-10-12 DIAGNOSIS — R2689 Other abnormalities of gait and mobility: Secondary | ICD-10-CM | POA: Diagnosis present

## 2020-10-12 DIAGNOSIS — M6281 Muscle weakness (generalized): Secondary | ICD-10-CM | POA: Diagnosis present

## 2020-10-12 NOTE — Patient Instructions (Signed)
    Hip Flexion (Standing)    Stand with support. Squeeze pelvic floor and hold. Lift right knee upward. Hold for _3__ seconds. Relax for _2__ seconds. Repeat _10_ times. Do _2 times a day.    Copyright  VHI. All rights reserved.

## 2020-10-13 ENCOUNTER — Encounter: Payer: Self-pay | Admitting: Physical Therapy

## 2020-10-13 NOTE — Therapy (Signed)
[facility] [address] [place], [place], [address] Phone: [phone]   Fax:  [phone]  Physical Therapy Treatment  Patient Details  Name: Catherine Fleming MRN: [id number] Date of Birth: 1955/11/06 Referring Provider (PT): Azzie Roup, FNP;  cc: Dr. Royston Sinner Corrington   Encounter Date: 10/12/2020   PT End of Session - 10/13/20 1353    Visit Number 2    Number of Visits 6    Date for PT Re-Evaluation 11/10/20    Authorization Type UHC Medicare    Authorization Time Period 10-05-20 - 12-04-20    PT Start Time 1105    PT Stop Time 1150    PT Time Calculation (min) 45 min    Activity Tolerance Patient tolerated treatment well    Behavior During Therapy [facility] for tasks assessed/performed           History reviewed. No pertinent past medical history.  History reviewed. No pertinent surgical history.  There were no vitals filed for this visit.   Subjective Assessment - 10/12/20 1107    Subjective Pt states exercises are going well; reports she is trying to walk more at home.  Went to the grocery store and walked from her car into the store without her walker  - states she gets tired easily    Patient Stated Goals Possibly wants to do aquatic therapy; wants to get off the rollator - wants to walk short distances in her home without the rollator    Currently in Pain? No/denies                             [facility] Adult PT Treatment/Exercise - 10/13/20 0001      Ambulation/Gait   Ambulation/Gait Yes    Ambulation/Gait Assistance 5: Supervision    Ambulation Distance (Feet) 60 Feet    Assistive device None    Gait Pattern Decreased weight shift to right;Antalgic    Ambulation Surface Level;Indoor      Exercises   Exercises Knee/Hip      Knee/Hip Exercises: Stretches   Active Hamstring Stretch Right;Left;1 rep;30 seconds   runner's stretch   Other Knee/Hip Stretches Lt hip adductor stretch - pt in  hooklying position - slowly letting LLE abduct and hold for 30 secs      Knee/Hip Exercises: Aerobic   Recumbent Bike SciFit level 3.0 x 5" with UE's & LE's      Knee/Hip Exercises: Supine   Bridges Both;1 set;10 reps    Bridges with Clamshell Both;1 set;5 sets;AROM    Straight Leg Raises AROM;Right;Left;1 set;10 reps   attempting to lift as high as opposite knee (flexed)   Other Supine Knee/Hip Exercises Bridges with LE extension 3 reps LLE; 3 reps RLE due to difficulty    Other Supine Knee/Hip Exercises hip abduction/adduction LLE in hooklying position with manual resistance               Balance Exercises - 10/13/20 0001      Balance Exercises: Standing   Marching Solid surface;Static;10 reps   with UE support on back of chair   Other Standing Exercises tap ups to 1st step 10 reps each foot with bil. UE support on rail    Other Standing Exercises Comments pt performed standing alternating hip flexion, extension, and abduction 10 reps each             PT Education - 10/13/20 1353    Education  Details added marching to HEP    Person(s) Educated Patient    Methods Explanation;Demonstration;Handout    Comprehension Verbalized understanding;Returned demonstration               PT Long Term Goals - 10/13/20 1410      PT LONG TERM GOAL #1   Title Pt will increase Berg balance test score from 31/56 to 38/56 for reduced fall risk.    Baseline 31/56    Time 6    Period Weeks    Status New      PT LONG TERM GOAL #2   Title Improve TUG score from 15.97 secs without rollator to </= 13.5 secs with no device for reduced fall risk.    Baseline 15.97 secs without rollator    Time 6    Period Weeks    Status New      PT LONG TERM GOAL #3   Title Pt will demo increased strength in bil. LE's so she is able to perform sit to stand transfer from standard chair with use of 1 arm rest only for assistance with standing.    Baseline bil. UE support needed    Time 6    Period  Weeks    Status New      PT LONG TERM GOAL #4   Title Pt will amb. 64' without device without LOB modified independently for household amb.    Time 6    Period Weeks    Status New      PT LONG TERM GOAL #5   Title Independent in HEP including aquatic exercises.    Time 6    Period Weeks    Status New                 Plan - 10/13/20 1354    Clinical Impression Statement Skilled Pt session focused on LE strengthening, gait training without device and balance training.  Pt c/o pain in Lt hip adductors with active Lt hip abduction and with eccentric SLR after lifting leg to height of opposite flexed knee.  Pt did report that muscles felt "loosened up" after performing exercise on SciFit bike.  Pt fatigues quickly - needs frequent seated rest breaks but of short duration.  Cont with POC.    Personal Factors and Comorbidities Fitness;Comorbidity 2;Time since onset of injury/illness/exacerbation    Comorbidities HTN, DM, morbid obesity, osteopenia (per pt report), bil. hip pain - pt states need for hip surgery in the next year    Examination-Activity Limitations Locomotion Level;Transfers;Squat;Stairs;Stand;Lift;Bend    Examination-Participation Restrictions Meal Prep;Cleaning;Community Activity;Shop;Laundry    Stability/Clinical Decision Making Evolving/Moderate complexity    Rehab Potential Good    PT Frequency 1x / week    PT Duration 6 weeks    PT Treatment/Interventions ADLs/Self Care Home Management;Aquatic Therapy;DME Instruction;Gait training;Stair training;Therapeutic activities;Therapeutic exercise;Balance training;Neuromuscular re-education;Patient/family education    PT Next Visit Plan add balance exs to HEP:  continue with balance and strengthening    PT Home Exercise Plan strengthening exs given on 10-05-20:  plan to add balance exercises to HEP    Consulted and Agree with Plan of Care Patient           Patient will benefit from skilled therapeutic intervention in  order to improve the following deficits and impairments:  Difficulty walking,Decreased activity tolerance,Decreased balance,Decreased strength,Decreased range of motion,Obesity,Pain  Visit Diagnosis: Other abnormalities of gait and mobility  Muscle weakness (generalized)  Unsteadiness on feet     Problem List  Patient Active Problem List   Diagnosis Date Noted  . COLONIC POLYPS, HX OF 04/25/2010  . OBESITY, MORBID 03/28/2010  . DM 02/26/2010  . HYPERLIPIDEMIA 02/26/2010  . HYPERTENSION 02/26/2010  . ABNORMAL ELECTROCARDIOGRAM 02/26/2010    Alda Lea, PT 10/13/2020, 2:13 PM  [facility] [address] [place], [place], [address] Phone: [phone]   Fax:  [phone]  Name: Catherine Fleming MRN: [id number] Date of Birth: 1956-04-22

## 2020-10-16 ENCOUNTER — Ambulatory Visit: Payer: Medicare Other | Admitting: Physical Therapy

## 2020-10-26 ENCOUNTER — Ambulatory Visit: Payer: Medicare Other | Admitting: Physical Therapy

## 2020-10-26 ENCOUNTER — Other Ambulatory Visit: Payer: Self-pay

## 2020-10-26 DIAGNOSIS — R2689 Other abnormalities of gait and mobility: Secondary | ICD-10-CM | POA: Diagnosis not present

## 2020-10-26 DIAGNOSIS — M6281 Muscle weakness (generalized): Secondary | ICD-10-CM

## 2020-10-27 ENCOUNTER — Encounter: Payer: Self-pay | Admitting: Physical Therapy

## 2020-10-27 NOTE — Therapy (Signed)
[facility] [address] [place], [place], [address] Phone: [phone]   Fax:  [phone]  Physical Therapy Treatment  Patient Details  Name: Catherine Fleming MRN: [id number] Date of Birth: 01-15-1956 Referring Provider (PT): Azzie Roup, FNP;  cc: Dr. Royston Sinner Corrington   Encounter Date: 10/26/2020   PT End of Session - 10/27/20 0850    Visit Number 3    Number of Visits 6    Date for PT Re-Evaluation 11/10/20    Authorization Type UHC Medicare    Authorization Time Period 10-05-20 - 12-04-20    PT Start Time 1245   pt arrived early for 1:15 appt time   PT Stop Time 1328    PT Time Calculation (min) 43 min    Activity Tolerance Patient tolerated treatment well;Patient limited by fatigue    Behavior During Therapy [facility] for tasks assessed/performed           History reviewed. No pertinent past medical history.  History reviewed. No pertinent surgical history.  There were no vitals filed for this visit.   Subjective Assessment - 10/26/20 1250    Subjective Pt states she is walking more without the rollator in her home - from her office to her bathroom; reports doing her exercises at home    Patient Stated Goals Possibly wants to do aquatic therapy; wants to get off the rollator - wants to walk short distances in her home without the rollator    Currently in Pain? No/denies                             [facility] Adult PT Treatment/Exercise - 10/27/20 0001      Transfers   Transfers Sit to Stand    Number of Reps 10 reps    Comments from mat table - no UE support used - pt had no difficulty performing this transfer      Knee/Hip Exercises: Aerobic   Recumbent Bike SciFit level 3.5 x 5" with UE's & LE's      Knee/Hip Exercises: Standing   Forward Step Up Right;1 set;10 reps;Hand Hold: 2;Step Height: 6"      Knee/Hip Exercises: Supine   Bridges Both;1 set;10 reps    Straight Leg Raises  AROM;Strengthening;Right;Left;2 sets;10 reps   2# weight used for 1 set 10 reps each leg   Other Supine Knee/Hip Exercises hip flexion with 2# weight RLE & LLE 10 reps - in hooklying position      Knee/Hip Exercises: Sidelying   Hip ABduction Strengthening;Right;Left;1 set;10 reps   2# weight   Clams 10 reps each leg with 2# weight          Pt performed standing bil. Hip flexion, extension, & abduction with red theraband 5 reps each leg each direction (5 reps due to c/o fatigue)        PT Education - 10/27/20 0849    Education Details pt was given red theraband for hip strengthening exercises - hip flexion, extension and abduction    Person(s) Educated Patient    Methods Explanation;Demonstration;Handout    Comprehension Verbalized understanding;Returned demonstration               PT Long Term Goals - 10/27/20 0854      PT LONG TERM GOAL #1   Title Pt will increase Berg balance test score from 31/56 to 38/56 for reduced fall risk.    Baseline 31/56  Time 6    Period Weeks    Status New      PT LONG TERM GOAL #2   Title Improve TUG score from 15.97 secs without rollator to </= 13.5 secs with no device for reduced fall risk.    Baseline 15.97 secs without rollator    Time 6    Period Weeks    Status New      PT LONG TERM GOAL #3   Title Pt will demo increased strength in bil. LE's so she is able to perform sit to stand transfer from standard chair with use of 1 arm rest only for assistance with standing.    Baseline bil. UE support needed    Time 6    Period Weeks    Status New      PT LONG TERM GOAL #4   Title Pt will amb. 33' without device without LOB modified independently for household amb.    Time 6    Period Weeks    Status New      PT LONG TERM GOAL #5   Title Independent in HEP including aquatic exercises.    Time 6    Period Weeks    Status New                 Plan - 10/27/20 6629    Clinical Impression Statement Pt demonstrating  increased strength in bil. LE's with pt able to perform sit to stand transfer from mat without UE support and also perform SLR each leg and hold resistance, with MMT grade 4/5 as tested in supine position.  Pt did report moderate fatigue at end of session with performing standing PRE's with red theraband for hip strengthening - performing 5 reps of hip flexion, abdct., and extension with each leg.  Cont with POC.    PT Next Visit Plan check PRE's added to HEP with red theraband           Patient will benefit from skilled therapeutic intervention in order to improve the following deficits and impairments:     Visit Diagnosis: Muscle weakness (generalized)  Other abnormalities of gait and mobility     Problem List Patient Active Problem List   Diagnosis Date Noted  . COLONIC POLYPS, HX OF 04/25/2010  . OBESITY, MORBID 03/28/2010  . DM 02/26/2010  . HYPERLIPIDEMIA 02/26/2010  . HYPERTENSION 02/26/2010  . ABNORMAL ELECTROCARDIOGRAM 02/26/2010    Kary Kos, PT 10/27/2020, 8:57 AM  [facility] [address] [place], [place], [address] Phone: [phone]   Fax:  [phone]  Name: Remmi Armenteros MRN: [id number] Date of Birth: 11/30/1955

## 2020-10-27 NOTE — Patient Instructions (Signed)
Resisted - Four Way Hip     With theraband around right ankle, balance on left leg and complete leg kicks in the following directions:  1. Facing toward theraband, extend right leg behind you with knee straight. Avoid bending forward at your hips. Repeat 10 times. 2. Turn to right, slowly kick leg out to the side while keeping your toes facing forward. Avoid leaning to the side. Repeat 10 times. 3. Turn to face away from theraband, kick leg straight forward keeping knee straight. Repeat 10  times. 4. Turn to the right and take a step away from the theraband. Pull right leg in and slightly in front of left. Repeat 10  times.  Then repeat the 4 positions with the band around the other ankle. 

## 2020-10-31 ENCOUNTER — Ambulatory Visit: Payer: Medicare Other | Admitting: Physical Therapy

## 2020-10-31 ENCOUNTER — Other Ambulatory Visit: Payer: Self-pay

## 2020-10-31 DIAGNOSIS — M6281 Muscle weakness (generalized): Secondary | ICD-10-CM

## 2020-10-31 DIAGNOSIS — R2681 Unsteadiness on feet: Secondary | ICD-10-CM

## 2020-10-31 DIAGNOSIS — R2689 Other abnormalities of gait and mobility: Secondary | ICD-10-CM | POA: Diagnosis not present

## 2020-10-31 NOTE — Patient Instructions (Signed)
  Aquatic Therapy: What to Expect!  Where:  [facility] [address] [place], [place]  [address] [phone]  NOTE:  You will receive an automated phone message reminding you of your appointment and it will say the appointment is at the Re[facility] on [address]  We are working to fix this- just know that you will meet us at the pool!  How to Prepare: . Please make sure you drink 8 ounces of water about one hour prior to your pool session . A caregiver MUST attend the entire session with the patient.  The caregiver will be responsible for assisting with dressing as well as any toileting needs.  If the patient will be doing a home program this should likely be the person who will assist as well.  . Patients must wear either their street shoes or pool shoes until they are ready to enter the pool with the therapist.  Patients must also wear either street shoes or pool shoes once exiting the pool to walk to the locker room.  This will helps us prevent slips and falls.  . Please arrive 15 minutes early to prepare for your pool therapy session . Sign in at the front desk on the clipboard marked for [name] . You may use the locker rooms on your right and then enter directly into the recreation pool (NOT the competition pool) . Please make sure to attend to any toileting needs prior to entering the pool . Please be dressed in your swim suit and on the pool deck at least 5 minutes before your appointment . Once on the pool deck your therapist will ask you to sign the Patient  Consent and Assignment of Benefits form . Your therapist may take your blood pressure prior to, during and after your session if indicated  About the pool  and parking: 1. Entering the pool Your therapist will assist you; there are 2 ways to enter:  stairs with railings or with a chair lift.   Your therapist will determine the most appropriate way for you. 2. Water temperature is usually  between 86-87 degrees 3. There may be other swimmers in the pool at the same time 4. Parking is free.   Contact Info:     Appointments: [facility]  All sessions are 45 minutes   [address]  [address]     Please call the [facility] if   [place], [place]   [address]    you need to cancel or reschedule an appointment.  [phone]       

## 2020-11-01 ENCOUNTER — Encounter: Payer: Self-pay | Admitting: Physical Therapy

## 2020-11-01 NOTE — Therapy (Signed)
[facility] [address] [place], [place], [address] Phone: [phone]   Fax:  [phone]  Physical Therapy Treatment  Patient Details  Name: Catherine Fleming MRN: [id number] Date of Birth: 1956/02/17 Referring Provider (PT): Azzie Roup, FNP;  cc: Dr. Royston Sinner Corrington   Encounter Date: 10/31/2020   PT End of Session - 11/01/20 1906    Visit Number 4    Number of Visits 6    Date for PT Re-Evaluation 11/10/20    Authorization Type [employer]    Authorization Time Period 10-05-20 - 12-04-20    PT Start Time 1245   pt arrived early for 1:15 appt time   PT Stop Time 1330    PT Time Calculation (min) 45 min    Activity Tolerance Patient tolerated treatment well;Patient limited by fatigue    Behavior During Therapy [facility] for tasks assessed/performed           History reviewed. No pertinent past medical history.  History reviewed. No pertinent surgical history.  There were no vitals filed for this visit.   Subjective Assessment - 11/01/20 1858    Subjective Pt states the use of the theraband for hip strengthening exercises increased joint pain - states she was unable to tolerate the band exercises; has ordered weights    Patient Stated Goals Possibly wants to do aquatic therapy; wants to get off the rollator - wants to walk short distances in her home without the rollator    Currently in Pain? No/denies                             [facility] Adult PT Treatment/Exercise - 11/01/20 0001      Transfers   Transfers Sit to Stand    Number of Reps 10 reps    Comments from mat table - no UE support used - pt had no difficulty performing this transfer      Knee/Hip Exercises: Aerobic   Recumbent Bike SciFit level 3.0 x 5" with UE's & LE's      Knee/Hip Exercises: Standing   Forward Step Up Right;1 set;10 reps;Hand Hold: 2;Step Height: 6"    Functional Squat 1 set;10 reps    Other Standing Knee Exercises  marching 10 reps each leg inside // bars with UE support      Knee/Hip Exercises: Supine   Bridges Both;1 set;10 reps    Bridges with Clamshell Both;1 set;5 sets;AROM    Straight Leg Raises AROM;Strengthening;Right;Left;2 sets;10 reps   no weight used for 1 set 10 reps each leg   Other Supine Knee/Hip Exercises Bridges with LE extension 3 reps LLE; 3 reps RLE due to difficulty    Other Supine Knee/Hip Exercises hip flexion with 2# weight RLE & LLE in hooklying position 10 reps      Knee/Hip Exercises: Sidelying   Hip ABduction Strengthening;Right;Left;1 set;10 reps   2# weight   Clams 10 reps each leg with 2# weight    Other Sidelying Knee/Hip Exercises pt performed hip abdct. with circles CW and CCW 5 reps each direction each leg                       PT Long Term Goals - 11/01/20 1908      PT LONG TERM GOAL #1   Title Pt will increase Berg balance test score from 31/56 to 38/56 for reduced fall risk.    Baseline 31/56  Time 6    Period Weeks    Status New      PT LONG TERM GOAL #2   Title Improve TUG score from 15.97 secs without rollator to </= 13.5 secs with no device for reduced fall risk.    Baseline 15.97 secs without rollator    Time 6    Period Weeks    Status New      PT LONG TERM GOAL #3   Title Pt will demo increased strength in bil. LE's so she is able to perform sit to stand transfer from standard chair with use of 1 arm rest only for assistance with standing.    Baseline bil. UE support needed    Time 6    Period Weeks    Status New      PT LONG TERM GOAL #4   Title Pt will amb. 74' without device without LOB modified independently for household amb.    Time 6    Period Weeks    Status New      PT LONG TERM GOAL #5   Title Independent in HEP including aquatic exercises.    Time 6    Period Weeks    Status New                 Plan - 11/01/20 1907    Clinical Impression Statement Pt is progressing well towards goals; slowly  improving strength and endurance; activity tolerance is limited by Rt hip pain and also by Lt groin pain (reported with SLR LLE).  Cont with POC.    PT Next Visit Plan Cont with PRE's           Patient will benefit from skilled therapeutic intervention in order to improve the following deficits and impairments:     Visit Diagnosis: Muscle weakness (generalized)  Unsteadiness on feet     Problem List Patient Active Problem List   Diagnosis Date Noted  . COLONIC POLYPS, HX OF 04/25/2010  . OBESITY, MORBID 03/28/2010  . DM 02/26/2010  . HYPERLIPIDEMIA 02/26/2010  . HYPERTENSION 02/26/2010  . ABNORMAL ELECTROCARDIOGRAM 02/26/2010    Kary Kos, PT 11/01/2020, 7:09 PM  [facility] [address] [place], [place], [address] Phone: [phone]   Fax:  [phone]  Name: Keslyn Teater MRN: [id number] Date of Birth: 1956-08-13

## 2021-10-15 ENCOUNTER — Other Ambulatory Visit (HOSPITAL_BASED_OUTPATIENT_CLINIC_OR_DEPARTMENT_OTHER): Payer: Self-pay

## 2021-10-15 MED ORDER — SEMAGLUTIDE (1 MG/DOSE) 4 MG/3ML ~~LOC~~ SOPN
PEN_INJECTOR | SUBCUTANEOUS | 3 refills | Status: AC
  Filled 2021-10-15: qty 9, 84d supply, fill #0

## 2022-01-24 ENCOUNTER — Other Ambulatory Visit (HOSPITAL_BASED_OUTPATIENT_CLINIC_OR_DEPARTMENT_OTHER): Payer: Self-pay

## 2022-01-24 MED ORDER — OZEMPIC (2 MG/DOSE) 8 MG/3ML ~~LOC~~ SOPN
PEN_INJECTOR | SUBCUTANEOUS | 1 refills | Status: DC
  Filled 2022-01-24 – 2022-07-19 (×2): qty 3, 28d supply, fill #0
  Filled 2023-01-20: qty 3, 28d supply, fill #1

## 2022-02-05 ENCOUNTER — Other Ambulatory Visit (HOSPITAL_BASED_OUTPATIENT_CLINIC_OR_DEPARTMENT_OTHER): Payer: Self-pay

## 2022-05-30 ENCOUNTER — Other Ambulatory Visit (HOSPITAL_BASED_OUTPATIENT_CLINIC_OR_DEPARTMENT_OTHER): Payer: Self-pay

## 2022-05-30 MED ORDER — OZEMPIC (2 MG/DOSE) 8 MG/3ML ~~LOC~~ SOPN
PEN_INJECTOR | SUBCUTANEOUS | 0 refills | Status: AC
  Filled 2022-05-30: qty 3, 28d supply, fill #0

## 2022-05-30 MED ORDER — JANUVIA 25 MG PO TABS
ORAL_TABLET | ORAL | 1 refills | Status: AC
  Filled 2022-05-30: qty 30, 30d supply, fill #0

## 2022-05-31 ENCOUNTER — Other Ambulatory Visit (HOSPITAL_BASED_OUTPATIENT_CLINIC_OR_DEPARTMENT_OTHER): Payer: Self-pay

## 2022-07-19 ENCOUNTER — Other Ambulatory Visit (HOSPITAL_BASED_OUTPATIENT_CLINIC_OR_DEPARTMENT_OTHER): Payer: Self-pay

## 2022-08-02 ENCOUNTER — Other Ambulatory Visit (HOSPITAL_BASED_OUTPATIENT_CLINIC_OR_DEPARTMENT_OTHER): Payer: Self-pay

## 2022-08-02 MED ORDER — OZEMPIC (2 MG/DOSE) 8 MG/3ML ~~LOC~~ SOPN
PEN_INJECTOR | SUBCUTANEOUS | 0 refills | Status: AC
  Filled 2022-08-02 – 2022-12-05 (×2): qty 3, 28d supply, fill #0

## 2022-10-08 ENCOUNTER — Other Ambulatory Visit (HOSPITAL_BASED_OUTPATIENT_CLINIC_OR_DEPARTMENT_OTHER): Payer: Self-pay

## 2022-10-08 MED ORDER — OZEMPIC (2 MG/DOSE) 8 MG/3ML ~~LOC~~ SOPN
2.0000 mg | PEN_INJECTOR | SUBCUTANEOUS | 0 refills | Status: AC
  Filled 2022-10-08: qty 3, 28d supply, fill #0

## 2022-12-05 ENCOUNTER — Other Ambulatory Visit (HOSPITAL_BASED_OUTPATIENT_CLINIC_OR_DEPARTMENT_OTHER): Payer: Self-pay

## 2023-01-20 ENCOUNTER — Other Ambulatory Visit (HOSPITAL_BASED_OUTPATIENT_CLINIC_OR_DEPARTMENT_OTHER): Payer: Self-pay

## 2023-02-07 ENCOUNTER — Other Ambulatory Visit (HOSPITAL_BASED_OUTPATIENT_CLINIC_OR_DEPARTMENT_OTHER): Payer: Self-pay

## 2023-02-07 MED ORDER — OZEMPIC (2 MG/DOSE) 8 MG/3ML ~~LOC~~ SOPN
2.0000 mg | PEN_INJECTOR | SUBCUTANEOUS | 2 refills | Status: AC
  Filled 2023-02-07 – 2023-04-09 (×2): qty 3, 28d supply, fill #0
  Filled 2023-05-27 – 2023-06-06 (×2): qty 3, 28d supply, fill #1

## 2023-04-09 ENCOUNTER — Other Ambulatory Visit (HOSPITAL_BASED_OUTPATIENT_CLINIC_OR_DEPARTMENT_OTHER): Payer: Self-pay

## 2023-05-26 ENCOUNTER — Other Ambulatory Visit (HOSPITAL_BASED_OUTPATIENT_CLINIC_OR_DEPARTMENT_OTHER): Payer: Self-pay

## 2023-05-27 ENCOUNTER — Other Ambulatory Visit (HOSPITAL_BASED_OUTPATIENT_CLINIC_OR_DEPARTMENT_OTHER): Payer: Self-pay

## 2023-06-06 ENCOUNTER — Other Ambulatory Visit (HOSPITAL_BASED_OUTPATIENT_CLINIC_OR_DEPARTMENT_OTHER): Payer: Self-pay

## 2023-06-20 ENCOUNTER — Other Ambulatory Visit (HOSPITAL_BASED_OUTPATIENT_CLINIC_OR_DEPARTMENT_OTHER): Payer: Self-pay

## 2023-06-20 MED ORDER — OZEMPIC (2 MG/DOSE) 8 MG/3ML ~~LOC~~ SOPN
2.0000 mg | PEN_INJECTOR | SUBCUTANEOUS | 2 refills | Status: DC
  Filled 2023-06-20 – 2023-06-27 (×2): qty 3, 28d supply, fill #0

## 2023-06-27 ENCOUNTER — Other Ambulatory Visit (HOSPITAL_BASED_OUTPATIENT_CLINIC_OR_DEPARTMENT_OTHER): Payer: Self-pay

## 2023-07-07 ENCOUNTER — Other Ambulatory Visit (HOSPITAL_BASED_OUTPATIENT_CLINIC_OR_DEPARTMENT_OTHER): Payer: Self-pay

## 2024-09-03 ENCOUNTER — Other Ambulatory Visit (HOSPITAL_BASED_OUTPATIENT_CLINIC_OR_DEPARTMENT_OTHER): Payer: Self-pay
